# Patient Record
Sex: Female | Born: 1935 | Race: Black or African American | Hispanic: No | Smoking: Current every day smoker
Health system: Southern US, Community
[De-identification: ages and names within clinical notes are randomized; demographics above are authoritative.]

## PROBLEM LIST (undated history)

## (undated) DIAGNOSIS — F209 Schizophrenia, unspecified: Secondary | ICD-10-CM

## (undated) DIAGNOSIS — F22 Delusional disorders: Secondary | ICD-10-CM

## (undated) DIAGNOSIS — F419 Anxiety disorder, unspecified: Secondary | ICD-10-CM

## (undated) DIAGNOSIS — I10 Essential (primary) hypertension: Secondary | ICD-10-CM

## (undated) DIAGNOSIS — J449 Chronic obstructive pulmonary disease, unspecified: Secondary | ICD-10-CM

---

## 2000-08-21 ENCOUNTER — Other Ambulatory Visit: Admission: RE | Admit: 2000-08-21 | Discharge: 2000-08-21 | Payer: Self-pay | Admitting: Family Medicine

## 2000-11-07 ENCOUNTER — Emergency Department (HOSPITAL_COMMUNITY): Admission: EM | Admit: 2000-11-07 | Discharge: 2000-11-07 | Payer: Self-pay | Admitting: *Deleted

## 2001-05-05 ENCOUNTER — Ambulatory Visit (HOSPITAL_COMMUNITY): Admission: RE | Admit: 2001-05-05 | Discharge: 2001-05-05 | Payer: Self-pay | Admitting: Family Medicine

## 2001-05-05 ENCOUNTER — Encounter: Payer: Self-pay | Admitting: Family Medicine

## 2001-06-12 ENCOUNTER — Other Ambulatory Visit: Admission: RE | Admit: 2001-06-12 | Discharge: 2001-06-12 | Payer: Self-pay | Admitting: Family Medicine

## 2002-09-25 ENCOUNTER — Ambulatory Visit (HOSPITAL_COMMUNITY): Admission: RE | Admit: 2002-09-25 | Discharge: 2002-09-25 | Payer: Self-pay | Admitting: Specialist

## 2002-09-25 ENCOUNTER — Encounter: Payer: Self-pay | Admitting: Family Medicine

## 2002-12-30 ENCOUNTER — Encounter: Payer: Self-pay | Admitting: Family Medicine

## 2002-12-30 ENCOUNTER — Ambulatory Visit (HOSPITAL_COMMUNITY): Admission: RE | Admit: 2002-12-30 | Discharge: 2002-12-30 | Payer: Self-pay | Admitting: Family Medicine

## 2003-01-05 ENCOUNTER — Ambulatory Visit (HOSPITAL_COMMUNITY): Admission: RE | Admit: 2003-01-05 | Discharge: 2003-01-05 | Payer: Self-pay | Admitting: Family Medicine

## 2003-01-05 ENCOUNTER — Encounter: Payer: Self-pay | Admitting: Family Medicine

## 2003-11-02 ENCOUNTER — Ambulatory Visit (HOSPITAL_COMMUNITY): Admission: RE | Admit: 2003-11-02 | Discharge: 2003-11-02 | Payer: Self-pay | Admitting: Family Medicine

## 2005-01-15 ENCOUNTER — Ambulatory Visit (HOSPITAL_COMMUNITY): Admission: RE | Admit: 2005-01-15 | Discharge: 2005-01-15 | Payer: Self-pay | Admitting: Family Medicine

## 2006-09-11 ENCOUNTER — Emergency Department (HOSPITAL_COMMUNITY): Admission: EM | Admit: 2006-09-11 | Discharge: 2006-09-11 | Payer: Self-pay | Admitting: *Deleted

## 2007-10-16 ENCOUNTER — Ambulatory Visit (HOSPITAL_COMMUNITY): Admission: RE | Admit: 2007-10-16 | Discharge: 2007-10-16 | Payer: Self-pay | Admitting: Ophthalmology

## 2007-11-13 ENCOUNTER — Ambulatory Visit (HOSPITAL_COMMUNITY): Admission: RE | Admit: 2007-11-13 | Discharge: 2007-11-13 | Payer: Self-pay | Admitting: Ophthalmology

## 2008-04-30 HISTORY — PX: TOTAL HIP ARTHROPLASTY: SHX124

## 2008-07-05 ENCOUNTER — Ambulatory Visit (HOSPITAL_COMMUNITY): Admission: RE | Admit: 2008-07-05 | Discharge: 2008-07-05 | Payer: Self-pay | Admitting: Family Medicine

## 2008-07-07 ENCOUNTER — Ambulatory Visit: Payer: Self-pay | Admitting: Orthopedic Surgery

## 2008-07-07 ENCOUNTER — Inpatient Hospital Stay (HOSPITAL_COMMUNITY): Admission: AD | Admit: 2008-07-07 | Discharge: 2008-07-14 | Payer: Self-pay | Admitting: Family Medicine

## 2008-07-08 ENCOUNTER — Encounter: Payer: Self-pay | Admitting: Orthopedic Surgery

## 2008-07-09 ENCOUNTER — Encounter: Payer: Self-pay | Admitting: Orthopedic Surgery

## 2008-08-09 ENCOUNTER — Ambulatory Visit: Payer: Self-pay | Admitting: Orthopedic Surgery

## 2008-08-09 DIAGNOSIS — S72033A Displaced midcervical fracture of unspecified femur, initial encounter for closed fracture: Secondary | ICD-10-CM

## 2008-08-10 ENCOUNTER — Telehealth: Payer: Self-pay | Admitting: Orthopedic Surgery

## 2008-08-17 ENCOUNTER — Encounter: Payer: Self-pay | Admitting: Orthopedic Surgery

## 2008-08-17 ENCOUNTER — Telehealth: Payer: Self-pay | Admitting: Orthopedic Surgery

## 2008-08-24 ENCOUNTER — Encounter: Payer: Self-pay | Admitting: Orthopedic Surgery

## 2008-09-09 ENCOUNTER — Telehealth: Payer: Self-pay | Admitting: Orthopedic Surgery

## 2008-10-11 ENCOUNTER — Ambulatory Visit: Payer: Self-pay | Admitting: Orthopedic Surgery

## 2010-06-02 IMAGING — CR DG FEMUR 2V*L*
2 series · 2 of 2 positions shown · non-contrast
Comparison: No priors

CLINICAL DATA: Cough and left hip pain

LEFT FEMUR - 2 VIEW

[view not recorded (1 of 2)]
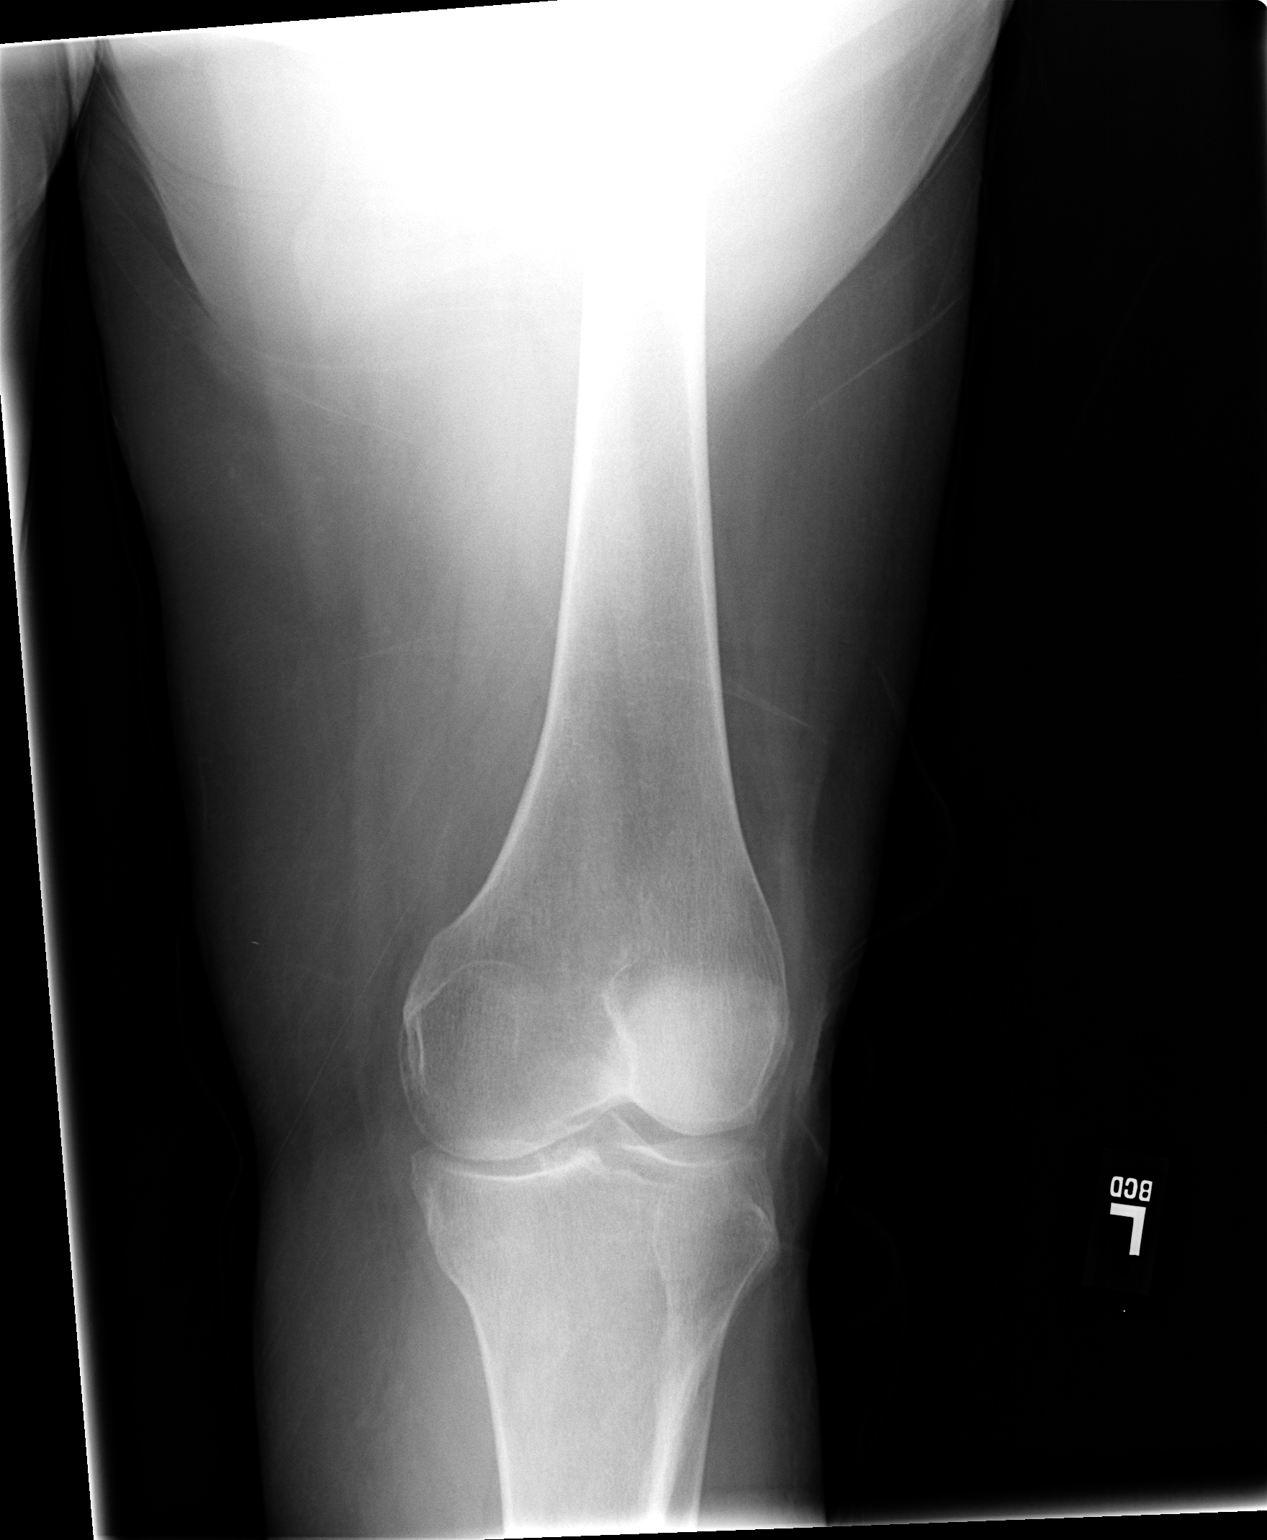

[view not recorded (2 of 2)]
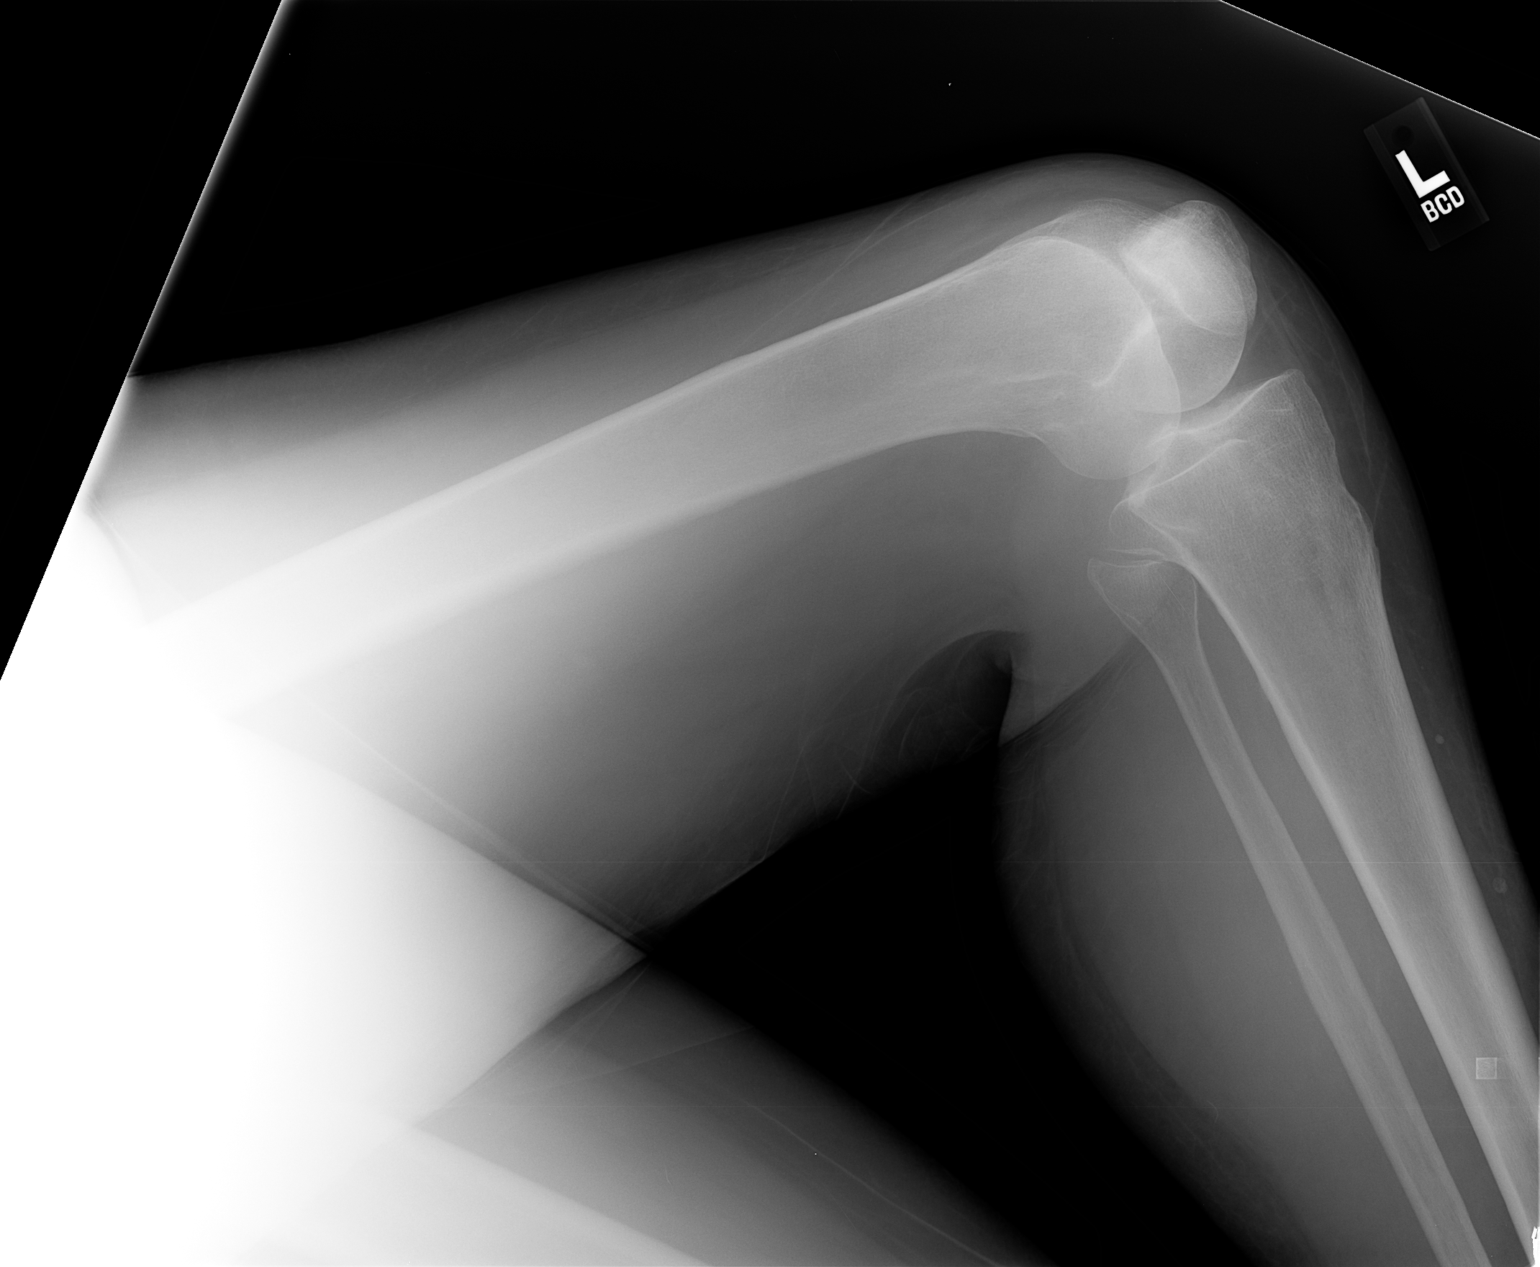

[2 of 2 positions shown; findings below may reference images not displayed]

FINDINGS: The femur is intact.  There is a left subcapital femoral
neck fracture that was discussed in earlier report.
IMPRESSION: No acute changes of the femur distal to the left femoral neck, with
a fracture.

## 2010-08-10 LAB — BASIC METABOLIC PANEL
BUN: 9 mg/dL (ref 6–23)
BUN: 11 mg/dL (ref 6–23)
BUN: 14 mg/dL (ref 6–23)
CO2: 30 mEq/L (ref 19–32)
CO2: 31 mEq/L (ref 19–32)
Calcium: 8.4 mg/dL (ref 8.4–10.5)
Calcium: 8 mg/dL — ABNORMAL LOW (ref 8.4–10.5)
Calcium: 8.3 mg/dL — ABNORMAL LOW (ref 8.4–10.5)
Chloride: 101 mEq/L (ref 96–112)
Chloride: 103 mEq/L (ref 96–112)
Creatinine, Ser: 0.57 mg/dL (ref 0.4–1.2)
Creatinine, Ser: 0.66 mg/dL (ref 0.4–1.2)
GFR calc Af Amer: >60 mL/min (ref >60)
GFR calc Af Amer: >60 mL/min (ref >60)
GFR calc Af Amer: >60 mL/min (ref >60)
GFR calc non Af Amer: >60 mL/min (ref >60)
Glucose, Bld: 97 mg/dL (ref 70–99)
Potassium: 4.1 mEq/L (ref 3.5–5.1)
Potassium: 4.6 mEq/L (ref 3.5–5.1)
Sodium: 136 mEq/L (ref 135–145)

## 2010-08-10 LAB — CBC
HCT: 28.9 % — ABNORMAL LOW (ref 36.0–46.0)
HCT: 25 % — ABNORMAL LOW (ref 36.0–46.0)
HCT: 28.6 % — ABNORMAL LOW (ref 36.0–46.0)
Hemoglobin: 10.8 g/dL — ABNORMAL LOW (ref 12.0–15.0)
Hemoglobin: 8.3 g/dL — ABNORMAL LOW (ref 12.0–15.0)
MCHC: 34.1 g/dL (ref 30.0–36.0)
MCHC: 34.3 g/dL (ref 30.0–36.0)
MCHC: 33.4 g/dL (ref 30.0–36.0)
MCHC: 33.7 g/dL (ref 30.0–36.0)
MCHC: 33.8 g/dL (ref 30.0–36.0)
MCV: 88.8 fL (ref 78.0–100.0)
MCV: 87.5 fL (ref 78.0–100.0)
MCV: 88.3 fL (ref 78.0–100.0)
Platelets: 235 10*3/uL (ref 150–400)
Platelets: 249 10*3/uL (ref 150–400)
Platelets: 191 10*3/uL (ref 150–400)
Platelets: 194 10*3/uL (ref 150–400)
RBC: 3.3 MIL/uL — ABNORMAL LOW (ref 3.87–5.11)
RBC: 2.84 MIL/uL — ABNORMAL LOW (ref 3.87–5.11)
RBC: 3.06 MIL/uL — ABNORMAL LOW (ref 3.87–5.11)
RBC: 3.66 MIL/uL — ABNORMAL LOW (ref 3.87–5.11)
RDW: 14.3 % (ref 11.5–15.5)
WBC: 7.3 10*3/uL (ref 4.0–10.5)
WBC: 10.4 10*3/uL (ref 4.0–10.5)
WBC: 7.5 10*3/uL (ref 4.0–10.5)
WBC: 7.7 10*3/uL (ref 4.0–10.5)
WBC: 9.8 10*3/uL (ref 4.0–10.5)

## 2010-08-10 LAB — DIFFERENTIAL
Basophils Absolute: 0 10*3/uL (ref 0.0–0.1)
Basophils Absolute: 0 10*3/uL (ref 0.0–0.1)
Basophils Absolute: 0 10*3/uL (ref 0.0–0.1)
Basophils Relative: 0 % (ref 0–1)
Basophils Relative: 0 % (ref 0–1)
Basophils Relative: 0 % (ref 0–1)
Basophils Relative: 0 % (ref 0–1)
Eosinophils Absolute: 0.1 10*3/uL (ref 0.0–0.7)
Eosinophils Absolute: 0 10*3/uL (ref 0.0–0.7)
Eosinophils Absolute: 0.1 10*3/uL (ref 0.0–0.7)
Eosinophils Relative: 1 % (ref 0–5)
Eosinophils Relative: 1 % (ref 0–5)
Eosinophils Relative: 1 % (ref 0–5)
Lymphocytes Relative: 7 % — ABNORMAL LOW (ref 12–46)
Lymphocytes Relative: 9 % — ABNORMAL LOW (ref 12–46)
Lymphs Abs: 0.9 10*3/uL (ref 0.7–4.0)
Lymphs Abs: 0.7 10*3/uL (ref 0.7–4.0)
Lymphs Abs: 1 10*3/uL (ref 0.7–4.0)
Monocytes Absolute: 0.9 10*3/uL (ref 0.1–1.0)
Monocytes Absolute: 1.1 10*3/uL — ABNORMAL HIGH (ref 0.1–1.0)
Monocytes Relative: 9 % (ref 3–12)
Monocytes Relative: 10 % (ref 3–12)
Monocytes Relative: 11 % (ref 3–12)
Neutro Abs: 5.7 10*3/uL (ref 1.7–7.7)
Neutro Abs: 6 10*3/uL (ref 1.7–7.7)
Neutro Abs: 7.8 10*3/uL — ABNORMAL HIGH (ref 1.7–7.7)
Neutro Abs: 8.4 10*3/uL — ABNORMAL HIGH (ref 1.7–7.7)
Neutrophils Relative %: 78 % — ABNORMAL HIGH (ref 43–77)
Neutrophils Relative %: 78 % — ABNORMAL HIGH (ref 43–77)
Neutrophils Relative %: 79 % — ABNORMAL HIGH (ref 43–77)

## 2010-08-10 LAB — CROSSMATCH

## 2010-08-10 LAB — COMPREHENSIVE METABOLIC PANEL
ALT: 39 U/L — ABNORMAL HIGH (ref 0–35)
AST: 39 U/L — ABNORMAL HIGH (ref 0–37)
Alkaline Phosphatase: 52 U/L (ref 39–117)
CO2: 30 mEq/L (ref 19–32)
Calcium: 8.8 mg/dL (ref 8.4–10.5)
Chloride: 99 mEq/L (ref 96–112)
GFR calc Af Amer: >60 mL/min (ref >60)
GFR calc non Af Amer: >60 mL/min (ref >60)
Glucose, Bld: 104 mg/dL — ABNORMAL HIGH (ref 70–99)
Sodium: 137 mEq/L (ref 135–145)
Total Bilirubin: 0.6 mg/dL (ref 0.3–1.2)

## 2010-08-10 LAB — PROTIME-INR: INR: 1 (ref 0.00–1.49)

## 2010-08-10 LAB — ABO/RH: ABO/RH(D): O POS

## 2010-08-10 LAB — T4, FREE: Free T4: 1.26 ng/dL (ref 0.89–1.80)

## 2010-08-10 LAB — TSH: TSH: 1.22 u[IU]/mL (ref 0.350–4.500)

## 2010-09-12 NOTE — Group Therapy Note (Signed)
[name], [name] NO.:  [id number]   MEDICAL RECORD NO.:  [id number]          PATIENT TYPE:  INP   LOCATION:  A309                          FACILITY:  [facility]   PHYSICIAN:  Vickki Hearing, M.D.DATE OF BIRTH:  [birth year]   DATE OF PROCEDURE:  DATE OF DISCHARGE:                                 PROGRESS NOTE   This is a second postop day status post bipolar hip replacement on the  left hip.  Her vital signs are 98.4 temperature, pulse 97, respiratory  rate 22, blood pressure 135/65, O2 sat 97 on 2 liters nasal cannula.  Lab results today show basic metabolic panel is normal.  CBC shows a  hemoglobin of 8.3.  We will transfuse her 2 units of blood.   She can progress with physical therapy as tolerated, seems to be  tolerating a low hemoglobin well.  Continue with postop protocol.      Vickki Hearing, M.D.  Electronically Signed     SEH/MEDQ  D:  07/11/2008  T:  07/12/2008  Job:  [id number]
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
## 2010-09-12 NOTE — Op Note (Signed)
[name], [name]             ACCOUNT NO.:  [id number]   MEDICAL RECORD NO.:  [id number]         PATIENT TYPE:  PINP   LOCATION:  IC09                          FACILITY:  [facility]   PHYSICIAN:  Vickki Hearing, M.D.DATE OF BIRTH:  March 19, 1932   DATE OF PROCEDURE:  07/09/2008  DATE OF DISCHARGE:                               OPERATIVE REPORT   Date of injury unknown.   HISTORY:  A 77 year old female presented to medical doctors office with  pain in her left hip and a limp.  The patient had been limping for  approximately 2 weeks.  She fell second time in her bathroom and was  unable to walk.  She was admitted to the hospital.  She had a admission  and clearance for surgery.   PREOPERATIVE DIAGNOSIS:  Left femoral neck fracture.   POSTOPERATIVE DIAGNOSIS:  Left femoral neck fracture.   PROCEDURE:  Bipolar partial hip replacement.   SURGEON:  Vickki Hearing, MD.   ANESTHETIC:  Spinal.   OPERATIVE FINDINGS:  There was a fracture of the femoral neck with no  evidence of hematoma indicating that this fracture was definitely old,  acetabulum was normal.   ASSISTANT:  Chief Financial Officer.   BLOOD LOSS:  250 mL, estimated.   INDICATIONS FOR PROCEDURE:  Hip fracture and the patient could not walk.   The patient was identified preoperatively and site marking was  performed.  The patient was taken to Surgery for spinal anesthetic and a  gram of Ancef was given.   The patient was then placed in lateral decubitus position with an  axillary roll and padding of the well leg and the left side was placed  in the upright position.   A Stulberg hip positioner was used to keep the patient in lateral  decubitus position.   After DuraPrep and sterile draping, time-out procedure was initiated and  completed.   A slight curvilinear incision was made over the greater trochanter  extended proximally and distally down to the fascial layer.   The fascial layer was split in line with the  skin incision and  retracted.  The anterior half of the gluteus medius and the entire  gluteus minimus were reflected from the greater trochanter and retracted  proximally and held in place with Steinmann pins.  A capsulotomy was  performed and the hip was dislocated anteriorly.  The femoral head was  removed and the acetabulum was inspected after thorough irrigation.   The head was measured and measured to size 49.   We then placed a starter reamer, box cutter, trochanteric lateralizer,  and then serial broaches up to a size 13, we trialed with a 04 and +8  neck length with a 49-hip ball and 49-hip ball with +8 neck gave the  best stability in flexion, internal rotation, and then extension,  external rotation, and we were able to maintain 90 degrees of knee  flexion.   The trial components were then removed, two #5 Tycron sutures were  passed through drill holes in the lateral femur and greater trochanter  and then the actual stem was implanted.  The head and neck were then  placed and range of motion tests were repeated and matched the trial  components.   We then repaired the abductors anatomically, repaired the fascial layer  with interrupted #1 Reglan sutures followed by 0 Monocryl and then  staples.  We did inject 30 mL Marcaine with epinephrine beneath the  fascial layer prior to closure.   The patient was placed in abduction pillow.  Leg lengths were checked  and there were found to be nearly anatomic and equal.   Postop plan, the patient will be full weightbearing, we will use foot  pumps, and Lovenox for DVT prevention, and we will arrange placement in  3 days.      Vickki Hearing, M.D.  Electronically Signed     SEH/MEDQ  D:  07/09/2008  T:  07/10/2008  Job:  [id number]

## 2010-09-12 NOTE — Consult Note (Signed)
[name], FARVE NO.:  [id number]   MEDICAL RECORD NO.:  [id number]          PATIENT TYPE:  INP   LOCATION:  A309                          FACILITY:  [facility]   PHYSICIAN:  Vickki Hearing, M.D.DATE OF BIRTH:  30-Jul-1931   DATE OF CONSULTATION:  DATE OF DISCHARGE:                                 CONSULTATION   She is admitted with a diagnosis of left femoral neck fracture.   She has a chief complaint of inability to walk with a limp, but only  mild left hip pain.   This is a 77 year old female with previous history of hypertension and  mental disease who is in her normal state of health at a local family  care home and was noted to be limping about a week or so ago and then  fell again in the bathroom and could not walk.  She was admitted to the  hospital and consult was requested for orthopedic evaluation.  X-rays  have already been done and showed a left femoral neck fracture.  It  appears to be more than several days older.  It is completely displaced  femoral neck fracture and will require partial hip replacement.  As far  as we can tell from the patient and her daughter there are no other  medical diseases.  The patient was in her normal state of health.  She  does take an inhaler, Haldol, antianxiety medicine and verapamil.  She  has no known drug allergies.  There is some history of paranoid  schizophrenia.   Examination shows blood pressure is 128/65, room air sat 98%, pulse 95,  respiratory rate 20, temperature 98.5.  Her overall appearance was  normal.  Her cardiovascular exam showed good distal pulses.  Her lymph  node exam.  Her abdominal exam showed protuberant abdomen, but it was  soft, nondistended, and nontender.  Her skin was dry, but intact with no  rashes or ulcers.  She was awake and alert.  She answered to questions  appropriately.  She was in pleasant mood.   Her upper extremities were very well aligned without contractures,  subluxation, atrophy, or tremor.  An excellent muscle strength and  muscle tone.   The right lower extremity the same.   Left lower extremity was held in external rotation and shortened with  the knee flexed.  There is pain with any motion of the left lower  extremity.   IMPRESSION:  Left femoral neck fracture, completely displaced.  Plan for  partial hip replacement.   Laboratory studies show T4 level of 1.26 normal.  TSH level of 1.220.  Comprehensive metabolic panel was normal with a glucose of 104.  Liver  function test showed SGOT and SGPT of 39 and 39 which are read as high,  total protein was low at 5.9, albumin was low at 2.7, calcium was 8.8,  white count was 7.5, hemoglobin 10.8, platelet count 192, neutrophils  78%.   There are no other surgical alternatives with this patient if she will  walk again.  She needs to have the hip replacement.  Risks of bleeding,  infection, deep vein thrombosis, pulmonary embolism, leg length  discrepancy are all noted.  Surgery is scheduled for Friday.      Vickki Hearing, M.D.  Electronically Signed     SEH/MEDQ  D:  07/08/2008  T:  07/08/2008  Job:  [id number]

## 2010-09-12 NOTE — H&P (Signed)
[name], [name] NO.:  [id number]   MEDICAL RECORD NO.:  [id number]          PATIENT TYPE:  INP   LOCATION:  A309                          FACILITY:  [facility]   PHYSICIAN:  Melvyn Novas, MDDATE OF BIRTH:  03-26-1932   DATE OF ADMISSION:  07/07/2008  DATE OF DISCHARGE:  LH                              HISTORY & PHYSICAL   The patient is a 77 year old black female, known paranoid schizophrenic  with a history of hypertension and generalized anxiety disorder who  resides in an assisted-living facility.  The patient is seen in my  office as an outpatient.  She has been very well controlled lately.  Apparently, she had an inability to walk for the last 4-5 days.  She was  given a wheelchair and presented to my office due to inability to bear  weight.  X-ray was gotten of her hip, which came back to my office and  revealed fracture of the left femoral neck I believe.  She was  subsequently admitted to the hospital today for addressing the above  situation.  She will be placed on Lovenox and orthopedic consultation  obtained.  She specifically denies any fall or trauma but, however,  according to the assisted living staff, she ceased walking at about 5  days prior to admission.  She is not quite reliable on history due to  the psychiatric abnormality.   PAST MEDICAL HISTORY:  Significant for the aforementioned paranoid  schizophrenia, hypertension, asthmatic bronchitis, delusions, and  generalized anxiety.   PAST SURGICAL HISTORY:  Unremarkable.   CURRENT MEDICATIONS:  1. Haldol 50 mg IM q.2 weeks.  2. Paxil 20 mg p.o. daily.  3. Xanax 0.25 mg p.o. b.i.d. p.r.n.  4. Xopenex inhaler 2 puffs q.i.d. p.r.n.   ALLERGIES:  She has no known allergies.   PHYSICAL EXAMINATION:  VITALS:  Blood pressure 136/73, temperature 97.9,  pulse is 86 and regular, respiratory rate is 20, and O2 sat is 99%.  HEENT:  Eyes, PERRLA.  Extraocular movements intact.  Sclerae  clear.  Conjunctivae pink.  NECK:  No JVD.  No carotid  bruits.  No thyromegaly.  LUNGS:  Clear to A and P.  No rales, wheezes, or rhonchi.  HEART:  Regular rhythm.  No murmurs, gallops, heaves, thrills, or rubs.  ABDOMEN:  Soft and nontender.  Bowel sounds are normoactive.  No  guarding, rebound, masses, or hepatosplenomegaly.  EXTREMITIES:  Trace to 1+ pedal edema.  Left leg shows pain on external  rotation and elevation, mild edema of the left leg all the way up.  Homans sign is negative.  No palpable cord.   IMPRESSION:  1. Fracture of left femoral neck.  2. Paranoid schizophrenia, well controlled.  3. Hypertension, well controlled.  4. Generalized anxiety, well controlled.   PLAN:  To admit and obtain orthopedic consult.  Lovenox 1 mg/kg subcu  q.24 h.  I will make further recommendations as the database expands.      Melvyn Novas, MD  Electronically Signed     RMD/MEDQ  D:  07/07/2008  T:  07/08/2008  Job:  [id number] 

## 2010-09-12 NOTE — Group Therapy Note (Signed)
NAMECHACE, BISCH NO.:  [id number]   MEDICAL RECORD NO.:  [id number]          PATIENT TYPE:  INP   LOCATION:  A309                          FACILITY:  [facility]   PHYSICIAN:  Vickki Hearing, M.D.DATE OF BIRTH:  07/07/1931   DATE OF PROCEDURE:  DATE OF DISCHARGE:                                 PROGRESS NOTE   She is postop day 1 from left bipolar hip replacement.  She is in stable  condition.  She is awake and alert.  She is sitting in a chair.  Her  vital signs are temperature 98.1 with a max of 101.1, pulse 97,  respirations 20, last blood pressure 107/69, and O2 sat 99%.   Laboratory results show basic metabolic panel is normal with a glucose  of 165 and her morning hemoglobin is 8.9.  She has a type and cross  match which expires on the 14th.  Her initial hemoglobin was 10.8, so we  have to transfuse her tomorrow that will be fine.   Otherwise, she is doing very well.      Vickki Hearing, M.D.  Electronically Signed     SEH/MEDQ  D:  07/10/2008  T:  07/11/2008  Job:  [id number]

## 2010-09-12 NOTE — Discharge Summary (Signed)
[name], [name] NO.:  [id number]   MEDICAL RECORD NO.:  [id number]          PATIENT TYPE:  INP   LOCATION:  A309                          FACILITY:  [facility]   PHYSICIAN:  Melvyn Novas, MDDATE OF BIRTH:  Sep 11, 1931   DATE OF ADMISSION:  07/07/2008  DATE OF DISCHARGE:  LH                               DISCHARGE SUMMARY   The patient is a 77 year old black female who is followed by me as an  outpatient.  She had chronic well-controlled paranoid schizophrenia and  generalized anxiety disorder, occasional delusions, hypertension, and  COPD.  She was doing well.  She apparently had inability to walk, she  had a fractured femoral neck which she had replaced with a left hip  arthroplasty by Dr. Romeo Apple.  She did well.  She had some mild postop  anemia.  She was transfused 2 units and she continued to do well.  She  was hemodynamically stable, had no significant postop complications  other than the aforementioned anemia.  She was continued on her current  medicines of Paxil 20 mg per day, Xanax 0.25 mg p.o. b.i.d., Xopenex  inhaler 2 puffs q.i.d.  Her schizophrenia medicines are Haldol 50 mg IM  every 2 weeks; this is an anticoagulative regimen, although I must admit  she is well controlled on this, and I have continued to do as an  outpatient in her assisted living facility that seems to work well.  She  will be transferred to an assisted living or skilled nursing facility  for several weeks.  It might be advantageous to continue the same  regimen in an anticipation she may be well enough to return to her  assisted living facility and continue her same outpatient regimen for  chronic schizophrenia, which is well controlled.      Melvyn Novas, MD  Electronically Signed     RMD/MEDQ  D:  07/13/2008  T:  07/14/2008  Job:  [id number]

## 2011-01-25 LAB — BASIC METABOLIC PANEL
BUN: 14
Chloride: 102
Creatinine, Ser: 0.86
Glucose, Bld: 84
Potassium: 3.4 — ABNORMAL LOW

## 2011-11-29 ENCOUNTER — Other Ambulatory Visit (HOSPITAL_COMMUNITY): Payer: Self-pay | Admitting: Family Medicine

## 2011-11-29 DIAGNOSIS — Z139 Encounter for screening, unspecified: Secondary | ICD-10-CM

## 2011-12-03 ENCOUNTER — Ambulatory Visit (HOSPITAL_COMMUNITY): Payer: Self-pay

## 2011-12-24 ENCOUNTER — Inpatient Hospital Stay (HOSPITAL_COMMUNITY): Admission: RE | Admit: 2011-12-24 | Payer: Self-pay | Source: Ambulatory Visit

## 2012-01-01 ENCOUNTER — Ambulatory Visit (HOSPITAL_COMMUNITY): Payer: Self-pay

## 2012-01-10 ENCOUNTER — Inpatient Hospital Stay (HOSPITAL_COMMUNITY): Admission: RE | Admit: 2012-01-10 | Payer: Self-pay | Source: Ambulatory Visit

## 2012-01-14 ENCOUNTER — Ambulatory Visit (HOSPITAL_COMMUNITY)
Admission: RE | Admit: 2012-01-14 | Discharge: 2012-01-14 | Disposition: A | Discharge status: Home / Self Care | Payer: Medicare Other | Source: Ambulatory Visit | Attending: Family Medicine | Admitting: Family Medicine

## 2012-01-14 DIAGNOSIS — Z1231 Encounter for screening mammogram for malignant neoplasm of breast: Secondary | ICD-10-CM | POA: Insufficient documentation

## 2012-01-14 DIAGNOSIS — Z139 Encounter for screening, unspecified: Secondary | ICD-10-CM

## 2014-09-10 ENCOUNTER — Other Ambulatory Visit (HOSPITAL_COMMUNITY): Payer: Self-pay | Admitting: Family Medicine

## 2014-09-10 ENCOUNTER — Ambulatory Visit (HOSPITAL_COMMUNITY)
Admission: RE | Admit: 2014-09-10 | Discharge: 2014-09-10 | Disposition: A | Discharge status: Home / Self Care | Payer: Medicare Other | Source: Ambulatory Visit | Attending: Family Medicine | Admitting: Family Medicine

## 2014-09-10 DIAGNOSIS — M25552 Pain in left hip: Secondary | ICD-10-CM | POA: Diagnosis present

## 2014-10-07 ENCOUNTER — Ambulatory Visit (INDEPENDENT_AMBULATORY_CARE_PROVIDER_SITE_OTHER): Payer: Medicare Other | Admitting: Orthopedic Surgery

## 2014-10-07 VITALS — BP 159/80 | Ht 62.0 in | Wt 187.0 lb

## 2014-10-07 DIAGNOSIS — R29898 Other symptoms and signs involving the musculoskeletal system: Secondary | ICD-10-CM

## 2014-10-07 DIAGNOSIS — Z9660 Presence of unspecified orthopedic joint implant: Secondary | ICD-10-CM

## 2014-10-07 DIAGNOSIS — Z96649 Presence of unspecified artificial hip joint: Secondary | ICD-10-CM

## 2014-10-07 NOTE — Patient Instructions (Signed)
Xray at hospital, will call with results

## 2014-10-07 NOTE — Progress Notes (Signed)
Patient ID: [name], female   DOB: 07-14-1931, 81 y.o.   MRN: [id number] New patient last seen in 2010 Chief Complaint  Patient presents with  . Leg Problem    Leg weakness Referred by Dr. Janna Arch.     [name] is a 81 y.o. female.   HPI 81 year old female had a left femoral neck fracture treated with a press-fit bipolar stem comes in complaining that she has weakness in her left leg and can't put pressure on it like she used to. She is at a care home and the caregiver says that she is limping worse than she did after her hip replacement and a he's noticed that she is having more trouble walking  She specifically denies pain in her left leg or thigh and denies numbness or tinging in her left leg just says it's week  She is a poor historian  She reports wheezing and anxiety as review of systems. No medical history as given no surgical history medication list medical allergies are noted on her history and the caregiver can give me much information family history was given  She does smoke doesn't drink   Review of Systems See hpi  No past medical history on file.  No past surgical history on file.  No family history on file.  Social History History  Substance Use Topics  . Smoking status: Not on file  . Smokeless tobacco: Not on file  . Alcohol Use: Not on file    No Known Allergies  No current outpatient prescriptions on file.   No current facility-administered medications for this visit.       Physical Exam Blood pressure 159/80, height 5\' 2"  (1.575 m), weight 187 lb (84.823 kg). Physical Exam The patient is well developed well nourished and well groomed. Orientation to person place and time is normal  Mood is pleasant. Ambulatory status she is a household ambulator with a rolling walker. Her motor exam shows normal hip flexion strength normal extension power normal dorsiflexion plantar flexion power in both legs bilaterally no instability in  the hip joint skin is intact neurovascular exam normal  Data Reviewed X-ray was done at the hospital it was read as a loose prosthesis but there is no loosening noted in the prosthesis or the x-ray. There has been some settling which is normal for this implant which was press-fit. I did look at the previous films. But disagree that there is any loosening of the stem.    Assessment Do not take loosening of the stem and I detect no weakness in her legs. I would like to get a lumbar spine film despite the fact that she had no lumbar spine tenderness. I would like to check this to make sure she doesn't have a L1 or 2 root lesion that would be pressing on her nerve to cause her weakness in the left leg Plan L-spine films call caregiver with results and make treatment decision after that

## 2014-10-12 ENCOUNTER — Other Ambulatory Visit: Payer: Self-pay | Admitting: Orthopedic Surgery

## 2014-10-12 ENCOUNTER — Ambulatory Visit (HOSPITAL_COMMUNITY)
Admission: RE | Admit: 2014-10-12 | Discharge: 2014-10-12 | Disposition: A | Discharge status: Home / Self Care | Payer: Medicare Other | Source: Ambulatory Visit | Attending: Orthopedic Surgery | Admitting: Orthopedic Surgery

## 2014-10-12 DIAGNOSIS — R29898 Other symptoms and signs involving the musculoskeletal system: Secondary | ICD-10-CM

## 2014-11-09 ENCOUNTER — Inpatient Hospital Stay (HOSPITAL_COMMUNITY): Payer: Medicare Other

## 2014-11-09 ENCOUNTER — Other Ambulatory Visit: Payer: Self-pay

## 2014-11-09 ENCOUNTER — Emergency Department (HOSPITAL_COMMUNITY): Payer: Medicare Other

## 2014-11-09 ENCOUNTER — Other Ambulatory Visit (HOSPITAL_COMMUNITY): Payer: Self-pay | Admitting: Family Medicine

## 2014-11-09 ENCOUNTER — Inpatient Hospital Stay (HOSPITAL_COMMUNITY)
Admission: EM | Admit: 2014-11-09 | Discharge: 2014-11-29 | DRG: 296 | Disposition: E | Payer: Medicare Other | Attending: Internal Medicine | Admitting: Internal Medicine

## 2014-11-09 ENCOUNTER — Ambulatory Visit (HOSPITAL_COMMUNITY)
Admission: RE | Admit: 2014-11-09 | Discharge: 2014-11-09 | Disposition: A | Discharge status: Home / Self Care | Payer: Medicare Other | Source: Ambulatory Visit | Attending: Family Medicine | Admitting: Family Medicine

## 2014-11-09 ENCOUNTER — Encounter (HOSPITAL_COMMUNITY): Payer: Self-pay | Admitting: Emergency Medicine

## 2014-11-09 DIAGNOSIS — R0902 Hypoxemia: Secondary | ICD-10-CM

## 2014-11-09 DIAGNOSIS — F17210 Nicotine dependence, cigarettes, uncomplicated: Secondary | ICD-10-CM | POA: Diagnosis present

## 2014-11-09 DIAGNOSIS — Z791 Long term (current) use of non-steroidal anti-inflammatories (NSAID): Secondary | ICD-10-CM

## 2014-11-09 DIAGNOSIS — E162 Hypoglycemia, unspecified: Secondary | ICD-10-CM | POA: Diagnosis not present

## 2014-11-09 DIAGNOSIS — R0602 Shortness of breath: Secondary | ICD-10-CM | POA: Insufficient documentation

## 2014-11-09 DIAGNOSIS — Z96642 Presence of left artificial hip joint: Secondary | ICD-10-CM | POA: Diagnosis present

## 2014-11-09 DIAGNOSIS — Z6837 Body mass index (BMI) 37.0-37.9, adult: Secondary | ICD-10-CM | POA: Diagnosis not present

## 2014-11-09 DIAGNOSIS — J988 Other specified respiratory disorders: Secondary | ICD-10-CM | POA: Diagnosis not present

## 2014-11-09 DIAGNOSIS — R400 Somnolence: Secondary | ICD-10-CM | POA: Diagnosis not present

## 2014-11-09 DIAGNOSIS — D638 Anemia in other chronic diseases classified elsewhere: Secondary | ICD-10-CM | POA: Diagnosis present

## 2014-11-09 DIAGNOSIS — J918 Pleural effusion in other conditions classified elsewhere: Secondary | ICD-10-CM | POA: Insufficient documentation

## 2014-11-09 DIAGNOSIS — J9690 Respiratory failure, unspecified, unspecified whether with hypoxia or hypercapnia: Secondary | ICD-10-CM | POA: Insufficient documentation

## 2014-11-09 DIAGNOSIS — Z9911 Dependence on respirator [ventilator] status: Secondary | ICD-10-CM | POA: Diagnosis not present

## 2014-11-09 DIAGNOSIS — E663 Overweight: Secondary | ICD-10-CM | POA: Diagnosis present

## 2014-11-09 DIAGNOSIS — J9602 Acute respiratory failure with hypercapnia: Secondary | ICD-10-CM | POA: Diagnosis present

## 2014-11-09 DIAGNOSIS — Z66 Do not resuscitate: Secondary | ICD-10-CM | POA: Diagnosis not present

## 2014-11-09 DIAGNOSIS — N179 Acute kidney failure, unspecified: Secondary | ICD-10-CM

## 2014-11-09 DIAGNOSIS — R4020 Unspecified coma: Secondary | ICD-10-CM | POA: Diagnosis present

## 2014-11-09 DIAGNOSIS — E875 Hyperkalemia: Secondary | ICD-10-CM | POA: Diagnosis not present

## 2014-11-09 DIAGNOSIS — I509 Heart failure, unspecified: Secondary | ICD-10-CM | POA: Insufficient documentation

## 2014-11-09 DIAGNOSIS — I469 Cardiac arrest, cause unspecified: Principal | ICD-10-CM | POA: Diagnosis present

## 2014-11-09 DIAGNOSIS — G931 Anoxic brain damage, not elsewhere classified: Secondary | ICD-10-CM | POA: Diagnosis present

## 2014-11-09 DIAGNOSIS — R569 Unspecified convulsions: Secondary | ICD-10-CM | POA: Diagnosis not present

## 2014-11-09 DIAGNOSIS — J13 Pneumonia due to Streptococcus pneumoniae: Secondary | ICD-10-CM | POA: Diagnosis present

## 2014-11-09 DIAGNOSIS — J449 Chronic obstructive pulmonary disease, unspecified: Secondary | ICD-10-CM | POA: Diagnosis present

## 2014-11-09 DIAGNOSIS — Z515 Encounter for palliative care: Secondary | ICD-10-CM

## 2014-11-09 DIAGNOSIS — Z79899 Other long term (current) drug therapy: Secondary | ICD-10-CM | POA: Diagnosis not present

## 2014-11-09 DIAGNOSIS — N170 Acute kidney failure with tubular necrosis: Secondary | ICD-10-CM | POA: Diagnosis present

## 2014-11-09 DIAGNOSIS — E872 Acidosis, unspecified: Secondary | ICD-10-CM

## 2014-11-09 DIAGNOSIS — F419 Anxiety disorder, unspecified: Secondary | ICD-10-CM | POA: Diagnosis present

## 2014-11-09 DIAGNOSIS — F209 Schizophrenia, unspecified: Secondary | ICD-10-CM | POA: Diagnosis present

## 2014-11-09 DIAGNOSIS — R4182 Altered mental status, unspecified: Secondary | ICD-10-CM | POA: Insufficient documentation

## 2014-11-09 DIAGNOSIS — R40243 Glasgow coma scale score 3-8: Secondary | ICD-10-CM | POA: Diagnosis not present

## 2014-11-09 DIAGNOSIS — J9601 Acute respiratory failure with hypoxia: Secondary | ICD-10-CM | POA: Diagnosis present

## 2014-11-09 DIAGNOSIS — G253 Myoclonus: Secondary | ICD-10-CM | POA: Diagnosis present

## 2014-11-09 DIAGNOSIS — I10 Essential (primary) hypertension: Secondary | ICD-10-CM | POA: Diagnosis present

## 2014-11-09 DIAGNOSIS — Z87891 Personal history of nicotine dependence: Secondary | ICD-10-CM

## 2014-11-09 HISTORY — DX: Delusional disorders: F22

## 2014-11-09 HISTORY — DX: Chronic obstructive pulmonary disease, unspecified: J44.9

## 2014-11-09 HISTORY — DX: Schizophrenia, unspecified: F20.9

## 2014-11-09 HISTORY — DX: Anxiety disorder, unspecified: F41.9

## 2014-11-09 HISTORY — DX: Essential (primary) hypertension: I10

## 2014-11-09 LAB — I-STAT CHEM 8, ED
BUN: 43 mg/dL — ABNORMAL HIGH (ref 6–20)
CHLORIDE: 105 mmol/L (ref 101–111)
Calcium, Ion: 1.09 mmol/L — ABNORMAL LOW (ref 1.13–1.30)
Creatinine, Ser: 2.1 mg/dL — ABNORMAL HIGH (ref 0.44–1.00)
Glucose, Bld: 182 mg/dL — ABNORMAL HIGH (ref 65–99)
HCT: 37 % (ref 36.0–46.0)
HEMOGLOBIN: 12.6 g/dL (ref 12.0–15.0)
POTASSIUM: 4.7 mmol/L (ref 3.5–5.1)
SODIUM: 146 mmol/L — AB (ref 135–145)
TCO2: 26 mmol/L (ref 0–100)

## 2014-11-09 LAB — APTT: APTT: 23 s — AB (ref 24–37)

## 2014-11-09 LAB — CBC WITH DIFFERENTIAL/PLATELET
BASOS ABS: 0 10*3/uL (ref 0.0–0.1)
Basophils Absolute: 0 10*3/uL (ref 0.0–0.1)
Basophils Relative: 0 % (ref 0–1)
Basophils Relative: 0 % (ref 0–1)
EOS ABS: 0 10*3/uL (ref 0.0–0.7)
EOS ABS: 0 10*3/uL (ref 0.0–0.7)
Eosinophils Relative: 0 % (ref 0–5)
Eosinophils Relative: 0 % (ref 0–5)
HCT: 36.2 % (ref 36.0–46.0)
HEMATOCRIT: 36.2 % (ref 36.0–46.0)
HEMOGLOBIN: 10.8 g/dL — AB (ref 12.0–15.0)
HEMOGLOBIN: 11.2 g/dL — AB (ref 12.0–15.0)
LYMPHS ABS: 2.2 10*3/uL (ref 0.7–4.0)
Lymphocytes Relative: 24 % (ref 12–46)
Lymphocytes Relative: 4 % — ABNORMAL LOW (ref 12–46)
Lymphs Abs: 0.6 10*3/uL — ABNORMAL LOW (ref 0.7–4.0)
MCH: 29 pg (ref 26.0–34.0)
MCH: 29.2 pg (ref 26.0–34.0)
MCHC: 29.8 g/dL — ABNORMAL LOW (ref 30.0–36.0)
MCHC: 30.9 g/dL (ref 30.0–36.0)
MCV: 97.3 fL (ref 78.0–100.0)
MCV: 94.3 fL (ref 78.0–100.0)
MONO ABS: 0.7 10*3/uL (ref 0.1–1.0)
MONOS PCT: 10 % (ref 3–12)
Monocytes Absolute: 1.3 10*3/uL — ABNORMAL HIGH (ref 0.1–1.0)
Monocytes Relative: 8 % (ref 3–12)
NEUTROS ABS: 10.8 10*3/uL — AB (ref 1.7–7.7)
Neutro Abs: 6.1 10*3/uL (ref 1.7–7.7)
Neutrophils Relative %: 68 % (ref 43–77)
Neutrophils Relative %: 86 % — ABNORMAL HIGH (ref 43–77)
PLATELETS: 186 10*3/uL (ref 150–400)
Platelets: 179 10*3/uL (ref 150–400)
RBC: 3.72 MIL/uL — AB (ref 3.87–5.11)
RBC: 3.84 MIL/uL — AB (ref 3.87–5.11)
RDW: 17.8 % — AB (ref 11.5–15.5)
RDW: 17.6 % — ABNORMAL HIGH (ref 11.5–15.5)
WBC: 9 10*3/uL (ref 4.0–10.5)
WBC: 12.6 10*3/uL — AB (ref 4.0–10.5)

## 2014-11-09 LAB — BLOOD GAS, ARTERIAL
ACID-BASE DEFICIT: 2.3 mmol/L — AB (ref 0.0–2.0)
Acid-Base Excess: 2.9 mmol/L — ABNORMAL HIGH (ref 0.0–2.0)
Bicarbonate: 24.9 mEq/L — ABNORMAL HIGH (ref 20.0–24.0)
Bicarbonate: 28.6 mEq/L — ABNORMAL HIGH (ref 20.0–24.0)
DRAWN BY: 234301
Drawn by: 398981
FIO2: 100 %
FIO2: 0.8 %
LHR: 15 {breaths}/min
LHR: 12 {breaths}/min
MECHVT: 500 mL
MECHVT: 500 mL
O2 SAT: 98.9 %
O2 SAT: 98.4 %
PEEP: 5 cmH2O
PEEP: 5 cmH2O
Patient temperature: 37
Patient temperature: 98.6
TCO2: 24.1 mmol/L (ref 0–100)
TCO2: 30.3 mmol/L (ref 0–100)
pCO2 arterial: 67.7 mmHg (ref 35.0–45.0)
pCO2 arterial: 58.2 mmHg (ref 35.0–45.0)
pH, Arterial: 7.192 — CL (ref 7.350–7.450)
pH, Arterial: 7.312 — ABNORMAL LOW (ref 7.350–7.450)
pO2, Arterial: 224 mmHg — ABNORMAL HIGH (ref 80.0–100.0)
pO2, Arterial: 119 mmHg — ABNORMAL HIGH (ref 80.0–100.0)

## 2014-11-09 LAB — COMPREHENSIVE METABOLIC PANEL
ALBUMIN: 3.6 g/dL (ref 3.5–5.0)
ALK PHOS: 100 U/L (ref 38–126)
ALT: 119 U/L — AB (ref 14–54)
ANION GAP: 14 (ref 5–15)
AST: 119 U/L — ABNORMAL HIGH (ref 15–41)
BUN: 45 mg/dL — ABNORMAL HIGH (ref 6–20)
CALCIUM: 8.1 mg/dL — AB (ref 8.9–10.3)
CO2: 26 mmol/L (ref 22–32)
CREATININE: 2.46 mg/dL — AB (ref 0.44–1.00)
Chloride: 105 mmol/L (ref 101–111)
GFR calc Af Amer: 20 mL/min — ABNORMAL LOW (ref >60)
GFR calc non Af Amer: 17 mL/min — ABNORMAL LOW (ref >60)
Glucose, Bld: 187 mg/dL — ABNORMAL HIGH (ref 65–99)
Potassium: 4.8 mmol/L (ref 3.5–5.1)
SODIUM: 145 mmol/L (ref 135–145)
Total Bilirubin: 0.8 mg/dL (ref 0.3–1.2)
Total Protein: 6.4 g/dL — ABNORMAL LOW (ref 6.5–8.1)

## 2014-11-09 LAB — LIPASE, BLOOD: LIPASE: 12 U/L — AB (ref 22–51)

## 2014-11-09 LAB — TROPONIN I: TROPONIN I: 0.08 ng/mL — AB (ref <0.031)

## 2014-11-09 LAB — BASIC METABOLIC PANEL
Anion gap: 10 (ref 5–15)
Anion gap: 8 (ref 5–15)
BUN: 45 mg/dL — AB (ref 6–20)
BUN: 45 mg/dL — ABNORMAL HIGH (ref 6–20)
CO2: 28 mmol/L (ref 22–32)
CO2: 30 mmol/L (ref 22–32)
Calcium: 8.1 mg/dL — ABNORMAL LOW (ref 8.9–10.3)
Calcium: 8 mg/dL — ABNORMAL LOW (ref 8.9–10.3)
Chloride: 107 mmol/L (ref 101–111)
Chloride: 107 mmol/L (ref 101–111)
Creatinine, Ser: 2.27 mg/dL — ABNORMAL HIGH (ref 0.44–1.00)
Creatinine, Ser: 2.3 mg/dL — ABNORMAL HIGH (ref 0.44–1.00)
GFR calc Af Amer: 22 mL/min — ABNORMAL LOW (ref >60)
GFR calc Af Amer: 22 mL/min — ABNORMAL LOW (ref >60)
GFR calc non Af Amer: 19 mL/min — ABNORMAL LOW (ref >60)
GFR calc non Af Amer: 19 mL/min — ABNORMAL LOW (ref >60)
Glucose, Bld: 138 mg/dL — ABNORMAL HIGH (ref 65–99)
Glucose, Bld: 154 mg/dL — ABNORMAL HIGH (ref 65–99)
POTASSIUM: 5.6 mmol/L — AB (ref 3.5–5.1)
Potassium: 4.7 mmol/L (ref 3.5–5.1)
SODIUM: 145 mmol/L (ref 135–145)
Sodium: 145 mmol/L (ref 135–145)

## 2014-11-09 LAB — AMYLASE: AMYLASE: 50 U/L (ref 28–100)

## 2014-11-09 LAB — MRSA PCR SCREENING: MRSA BY PCR: NEGATIVE

## 2014-11-09 LAB — URINALYSIS, ROUTINE W REFLEX MICROSCOPIC
BILIRUBIN URINE: NEGATIVE
GLUCOSE, UA: NEGATIVE mg/dL
Ketones, ur: NEGATIVE mg/dL
Leukocytes, UA: NEGATIVE
NITRITE: NEGATIVE
Protein, ur: 100 mg/dL — AB
Urobilinogen, UA: 1 mg/dL (ref 0.0–1.0)
pH: 6 (ref 5.0–8.0)

## 2014-11-09 LAB — URINE MICROSCOPIC-ADD ON

## 2014-11-09 LAB — BRAIN NATRIURETIC PEPTIDE: B NATRIURETIC PEPTIDE 5: 1226 pg/mL — AB (ref 0.0–100.0)

## 2014-11-09 LAB — GLUCOSE, CAPILLARY
GLUCOSE-CAPILLARY: 115 mg/dL — AB (ref 65–99)
Glucose-Capillary: 135 mg/dL — ABNORMAL HIGH (ref 65–99)

## 2014-11-09 LAB — CBG MONITORING, ED: GLUCOSE-CAPILLARY: 126 mg/dL — AB (ref 65–99)

## 2014-11-09 LAB — MAGNESIUM: Magnesium: 2.2 mg/dL (ref 1.7–2.4)

## 2014-11-09 LAB — I-STAT TROPONIN, ED: TROPONIN I, POC: 0.07 ng/mL (ref 0.00–0.08)

## 2014-11-09 LAB — TSH: TSH: 1.885 u[IU]/mL (ref 0.350–4.500)

## 2014-11-09 LAB — AMMONIA: Ammonia: 38 umol/L — ABNORMAL HIGH (ref 9–35)

## 2014-11-09 LAB — PROTIME-INR
INR: 1.2 (ref 0.00–1.49)
Prothrombin Time: 15.4 seconds — ABNORMAL HIGH (ref 11.6–15.2)

## 2014-11-09 LAB — ALKALINE PHOSPHATASE: Alkaline Phosphatase: 94 U/L (ref 38–126)

## 2014-11-09 LAB — I-STAT CG4 LACTIC ACID, ED: Lactic Acid, Venous: 6.98 mmol/L (ref 0.5–2.0)

## 2014-11-09 LAB — PHOSPHORUS: PHOSPHORUS: 4 mg/dL (ref 2.5–4.6)

## 2014-11-09 MED ORDER — MIDAZOLAM HCL 2 MG/2ML IJ SOLN: 1.0000 mg | INTRAMUSCULAR | Status: DC | PRN

## 2014-11-09 MED ORDER — SODIUM CHLORIDE 0.9 % IV SOLN
INTRAVENOUS | Status: DC
  Administered 2014-11-09: 17:00:00 via INTRAVENOUS

## 2014-11-09 MED ORDER — ONDANSETRON HCL 4 MG/2ML IJ SOLN: 4.0000 mg | Freq: Four times a day (QID) | INTRAMUSCULAR | Status: DC | PRN

## 2014-11-09 MED ORDER — FENTANYL BOLUS VIA INFUSION
25.0000 ug | INTRAVENOUS | Status: DC | PRN
  Filled 2014-11-09: qty 25

## 2014-11-09 MED ORDER — FENTANYL CITRATE (PF) 100 MCG/2ML IJ SOLN: 50.0000 ug | INTRAMUSCULAR | Status: DC | PRN

## 2014-11-09 MED ORDER — SODIUM CHLORIDE 0.9 % IV SOLN: INTRAVENOUS | Status: DC

## 2014-11-09 MED ORDER — ASPIRIN 81 MG PO CHEW: 324.0000 mg | CHEWABLE_TABLET | ORAL | Status: AC

## 2014-11-09 MED ORDER — EPINEPHRINE HCL 0.1 MG/ML IJ SOSY
PREFILLED_SYRINGE | INTRAMUSCULAR | Status: AC | PRN
  Administered 2014-11-09 (×2): 1 mg via INTRAVENOUS

## 2014-11-09 MED ORDER — SODIUM CHLORIDE 0.9 % IV SOLN
2000.0000 mL | Freq: Once | INTRAVENOUS | Status: AC
  Administered 2014-11-09: 1000 mL via INTRAVENOUS

## 2014-11-09 MED ORDER — SODIUM CHLORIDE 0.9 % IV SOLN
25.0000 ug/h | INTRAVENOUS | Status: DC
  Administered 2014-11-09: 100 ug/h via INTRAVENOUS
  Administered 2014-11-10 – 2014-11-11 (×2): 150 ug/h via INTRAVENOUS
  Filled 2014-11-09 (×3): qty 50

## 2014-11-09 MED ORDER — SODIUM CHLORIDE 0.9 % IV BOLUS (SEPSIS)
500.0000 mL | Freq: Once | INTRAVENOUS | Status: AC
  Administered 2014-11-09: 500 mL via INTRAVENOUS

## 2014-11-09 MED ORDER — MIDAZOLAM HCL 5 MG/ML IJ SOLN
1.0000 mg/h | INTRAMUSCULAR | Status: DC
  Administered 2014-11-10: 1 mg/h via INTRAVENOUS
  Filled 2014-11-09 (×3): qty 10

## 2014-11-09 MED ORDER — ASPIRIN 300 MG RE SUPP
300.0000 mg | RECTAL | Status: AC
  Administered 2014-11-09: 300 mg via RECTAL
  Filled 2014-11-09: qty 1

## 2014-11-09 MED ORDER — LACTULOSE 10 GM/15ML PO SOLN
20.0000 g | Freq: Once | ORAL | Status: AC
  Administered 2014-11-09: 20 g via ORAL
  Filled 2014-11-09: qty 30

## 2014-11-09 MED ORDER — CHLORHEXIDINE GLUCONATE 0.12 % MT SOLN
15.0000 mL | Freq: Two times a day (BID) | OROMUCOSAL | Status: DC
  Administered 2014-11-10 (×2): 15 mL via OROMUCOSAL
  Filled 2014-11-09 (×3): qty 15

## 2014-11-09 MED ORDER — SODIUM CHLORIDE 0.9 % IV SOLN
INTRAVENOUS | Status: AC | PRN
  Administered 2014-11-09: 1000 mL via INTRAVENOUS

## 2014-11-09 MED ORDER — SODIUM CHLORIDE 0.9 % IV SOLN: 250.0000 mL | INTRAVENOUS | Status: DC | PRN

## 2014-11-09 MED ORDER — FAMOTIDINE 40 MG/5ML PO SUSR
20.0000 mg | Freq: Two times a day (BID) | ORAL | Status: DC
  Administered 2014-11-09: 20 mg
  Filled 2014-11-09 (×3): qty 2.5

## 2014-11-09 MED ORDER — SODIUM CHLORIDE 0.9 % IV BOLUS (SEPSIS)
1000.0000 mL | Freq: Once | INTRAVENOUS | Status: AC
  Administered 2014-11-09: 1000 mL via INTRAVENOUS

## 2014-11-09 MED ORDER — ALBUTEROL SULFATE (2.5 MG/3ML) 0.083% IN NEBU
2.5000 mg | INHALATION_SOLUTION | RESPIRATORY_TRACT | Status: DC
  Administered 2014-11-09: 2.5 mg via RESPIRATORY_TRACT
  Filled 2014-11-09: qty 3

## 2014-11-09 MED ORDER — DEXMEDETOMIDINE HCL IN NACL 200 MCG/50ML IV SOLN
0.0000 ug/kg/h | INTRAVENOUS | Status: DC
  Filled 2014-11-09: qty 50

## 2014-11-09 MED ORDER — ACETAMINOPHEN 325 MG PO TABS: 650.0000 mg | ORAL_TABLET | ORAL | Status: DC | PRN

## 2014-11-09 MED ORDER — INSULIN ASPART 100 UNIT/ML ~~LOC~~ SOLN
2.0000 [IU] | SUBCUTANEOUS | Status: DC
  Administered 2014-11-09: 2 [IU] via SUBCUTANEOUS

## 2014-11-09 MED ORDER — MIDAZOLAM HCL 2 MG/2ML IJ SOLN
1.0000 mg | INTRAMUSCULAR | Status: AC | PRN
  Administered 2014-11-09 (×3): 1 mg via INTRAVENOUS
  Filled 2014-11-09 (×3): qty 2

## 2014-11-09 MED ORDER — SODIUM CHLORIDE 0.9 % IV SOLN
INTRAVENOUS | Status: DC
  Administered 2014-11-09: 19:00:00 via INTRAVENOUS

## 2014-11-09 MED ORDER — CETYLPYRIDINIUM CHLORIDE 0.05 % MT LIQD
7.0000 mL | Freq: Four times a day (QID) | OROMUCOSAL | Status: DC
  Administered 2014-11-10 – 2014-11-11 (×5): 7 mL via OROMUCOSAL

## 2014-11-09 MED ORDER — FENTANYL CITRATE (PF) 100 MCG/2ML IJ SOLN
50.0000 ug | Freq: Once | INTRAMUSCULAR | Status: AC
  Administered 2014-11-09: 50 ug via INTRAVENOUS

## 2014-11-09 MED ORDER — ENOXAPARIN SODIUM 30 MG/0.3ML ~~LOC~~ SOLN
30.0000 mg | SUBCUTANEOUS | Status: DC
  Administered 2014-11-09: 30 mg via SUBCUTANEOUS
  Filled 2014-11-09 (×2): qty 0.3

## 2014-11-09 MED ORDER — SODIUM BICARBONATE 8.4 % IV SOLN
INTRAVENOUS | Status: AC | PRN
  Administered 2014-11-09: 50 meq via INTRAVENOUS

## 2014-11-09 NOTE — Code Documentation (Signed)
Pt noted to be in organized rhythm on monitor and pulse check revealed strong femoral pulse.  CPR discontinued at this time.

## 2014-11-09 NOTE — Progress Notes (Signed)
eLink Physician-Brief Progress Note Patient Name: Gabriella DumasDoretha K Fry DOB: 07/29/1931 MRN: [id number]   Date of Service  11/25/2014  HPI/Events of Note  hyperkalemia  eICU Interventions  Lactulose.      Intervention Category Intermediate Interventions: Electrolyte abnormality - evaluation and management  Shane Crutchradeep Phala Schraeder 11/07/2014, 9:17 PM

## 2014-11-09 NOTE — Code Documentation (Signed)
Pt arrived to ED via private vehicle after episode of difficulty breathing after which pt became unresponsive.  Unsure of how long pt was unresponsive PTA. Compressions initiated upon arrival to ED.  Pt found to be in asystole after being placed on monitor.  Code began.

## 2014-11-09 NOTE — ED Provider Notes (Signed)
CSN: [id number]     Arrival date & time 11/24/14  1526 History   First MD Initiated Contact with Patient 24-Nov-2014 1543     Chief Complaint  Patient presents with  . Cardiac Arrest     The history is provided by a caregiver. The history is limited by the condition of the patient (unresponsive).  Pt was seen at 1525. Per pt's driver/facility staff: States he was driving pt to various appointments today (labs, XR, doctor's office) when they stopped for a meal at a fast food restaurant, then started on their way to a doctor's office appointment. He states shortly after pt ate the meal, she "looked like she was having trouble breathing." He denies pt was choking or gagging. Pt then became unresponsive and "stopped breathing." Staff states he drove pt to the ED. Unknown time of unresponsiveness. ED staff removed pt from private vehicle unresponsive, apneic and pulseless.     Past Medical History  Diagnosis Date  . COPD (chronic obstructive pulmonary disease)   . Hypertension   . Schizophrenia   . Anxiety   . Delusions    Past Surgical History  Procedure Laterality Date  . Total hip arthroplasty Left 2010    History  Substance Use Topics  . Smoking status: Current Every Day Smoker  . Smokeless tobacco: Not on file  . Alcohol Use: No    Review of Systems  Unable to perform ROS: Patient unresponsive      Allergies  Review of patient's allergies indicates no known allergies.  Home Medications   Prior to Admission medications   Medication Sig Start Date End Date Taking? Authorizing Provider  haloperidol (HALDOL) 5 MG tablet Take 5 mg by mouth 2 (two) times daily. 11/04/14   Historical Provider, MD  naproxen (NAPROSYN) 500 MG tablet Take 500 mg by mouth 2 (two) times daily. 11/08/14   Historical Provider, MD  PARoxetine (PAXIL) 20 MG tablet Take 20 mg by mouth daily. 11/04/14   Historical Provider, MD  PROAIR HFA 108 (90 BASE) MCG/ACT inhaler Inhale 2 puffs into the lungs every 6 (six)  hours as needed. 24-Nov-2014   Historical Provider, MD  triamterene-hydrochlorothiazide (MAXZIDE-25) 37.5-25 MG per tablet Take 1 tablet by mouth daily. 11/04/14   Historical Provider, MD  verapamil (CALAN-SR) 240 MG CR tablet Take 240 mg by mouth daily. 11/04/14   Historical Provider, MD   BP 126/61 mmHg  Pulse 77  Resp 15  SpO2 100%   Filed Vitals:   11/24/14 1600 11/24/14 1615 11-24-2014 1630 November 24, 2014 1656  BP: 126/61 102/70 96/55 99/56   Pulse: 77 68 68 66  Resp: 15 15 15 16   SpO2: 100% 96% 96% 100%    Physical Exam  1525: Physical examination: Vital signs and O2 SAT: Reviewed; Constitutional: Well developed, Well nourished, Well hydrated, Unresponsive; Head and Face: Normocephalic, Atraumatic; Eyes: Pupils 3mm bilat, sluggish; ENMT:  Mucous membranes moist; Neck: No lymphadenopathy, No crepitus, Trachea midline; Cardiovascular: Heart sounds absent, Pulses absent; Respiratory: Breath sounds clear & equal bilaterally, Apnea, Bag-valve-mask ventilated; Chest: No deformity, Movement normal, No crepitus; Abdomen: Soft, Nondistended; Extremities: No deformity, +1 pedal edema bilat. No calf asymmetry.; Neuro: Unresponsive, GCS 3.; Skin: Color normal, Dry, Cool   ED Course  Procedures    Cardiopulmonary Resuscitation (CPR) Procedure Note Directed/Performed by: Laray Anger I personally directed ancillary staff and/or performed CPR in an effort to regain return of spontaneous circulation and to maintain cardiac, neuro and systemic perfusion.    Airway procedure:  Timeout:  Pre-procedure timeout not performed due to emergent nature of procedure; Indication: Unresponsive;  Oxygen Saturation: 100 %; Oxygen concentration: 100 %; Preoxygenation: Bag-valve-mask;  Medication: none; Procedure: Suctioning of small amount of foodstuff in posterior pharynx. No gag. Glidescope laryngoscopy, Endotracheal intubation with 7.185mm cuffed endotracheal tube, Bag-valve-tube ventilation, Mechanical ventilation;   Reassessment: Successful intubation, No bleeding or obvious trauma in oral cavity or posterior pharynx. Breath sounds equal bilaterally, No breath sounds heard over stomach, Chest movement symmetrical, CO2 detector color change, Endotracheal tube fogging, Oxygen saturation normal. Post-procedure xray obtained.       EKG Interpretation   Date/Time:  Tuesday November 09 2014 15:40:42 EDT Ventricular Rate:  64 PR Interval:  243 QRS Duration: 76 QT Interval:  446 QTC Calculation: 460 R Axis:   96 Text Interpretation:  Sinus rhythm with 1st degree A-V block Atrial  premature complex Probable lateral infarct, age indeterminate Anteroseptal  infarct, age indeterminate Nonspecific ST and T wave abnormality When  compared with ECG of 07/07/2008 Nonspecific ST and T wave abnormality is  now Present PR interval has increased Confirmed by [facility]ANUS  MD, Nicholos JohnsKATHLEEN  [phone] on 06-01-14 4:51:24 PM      MDM  MDM Reviewed: previous chart, nursing note and vitals Reviewed previous: labs and ECG Interpretation: labs, ECG and x-ray Total time providing critical care: 30-74 minutes. This excludes time spent performing separately reportable procedures and services. Consults: admitting MD and critical care     CRITICAL CARE Performed by: Laray AngerMCMANUS,Sedonia Kitner M Total critical care time: 45 Critical care time was exclusive of separately billable procedures and treating other patients. Critical care was necessary to treat or prevent imminent or life-threatening deterioration. Critical care was time spent personally by me on the following activities: development of treatment plan with patient and/or surrogate as well as nursing, discussions with consultants, evaluation of patient's response to treatment, examination of patient, obtaining history from patient or surrogate, ordering and performing treatments and interventions, ordering and review of laboratory studies, ordering and review of radiographic studies,  pulse oximetry and re-evaluation of patient's condition.  Results for orders placed or performed during the hospital encounter of 07/22/2014  Comprehensive metabolic panel  Result Value Ref Range   Sodium 145 135 - 145 mmol/L   Potassium 4.8 3.5 - 5.1 mmol/L   Chloride 105 101 - 111 mmol/L   CO2 26 22 - 32 mmol/L   Glucose, Bld 187 (H) 65 - 99 mg/dL   BUN 45 (H) 6 - 20 mg/dL   Creatinine, Ser 4.692.46 (H) 0.44 - 1.00 mg/dL   Calcium 8.1 (L) 8.9 - 10.3 mg/dL   Total Protein 6.4 (L) 6.5 - 8.1 g/dL   Albumin 3.6 3.5 - 5.0 g/dL   AST 629119 (H) 15 - 41 U/L   ALT 119 (H) 14 - 54 U/L   Alkaline Phosphatase 100 38 - 126 U/L   Total Bilirubin 0.8 0.3 - 1.2 mg/dL   GFR calc non Af Amer 17 (L) >60 mL/min   GFR calc Af Amer 20 (L) >60 mL/min   Anion gap 14 5 - 15  Protime-INR  Result Value Ref Range   Prothrombin Time 15.4 (H) 11.6 - 15.2 seconds   INR 1.20 0.00 - 1.49  CBC with Differential  Result Value Ref Range   WBC 9.0 4.0 - 10.5 K/uL   RBC 3.72 (L) 3.87 - 5.11 MIL/uL   Hemoglobin 10.8 (L) 12.0 - 15.0 g/dL   HCT 52.836.2 41.336.0 - 24.446.0 %   MCV 97.3 78.0 -  100.0 fL   MCH 29.0 26.0 - 34.0 pg   MCHC 29.8 (L) 30.0 - 36.0 g/dL   RDW 62.1 (H) 30.8 - 65.7 %   Platelets 186 150 - 400 K/uL   Neutrophils Relative % 68 43 - 77 %   Neutro Abs 6.1 1.7 - 7.7 K/uL   Lymphocytes Relative 24 12 - 46 %   Lymphs Abs 2.2 0.7 - 4.0 K/uL   Monocytes Relative 8 3 - 12 %   Monocytes Absolute 0.7 0.1 - 1.0 K/uL   Eosinophils Relative 0 0 - 5 %   Eosinophils Absolute 0.0 0.0 - 0.7 K/uL   Basophils Relative 0 0 - 1 %   Basophils Absolute 0.0 0.0 - 0.1 K/uL  Blood gas, arterial  Result Value Ref Range   FIO2 100.00 %   Delivery systems VENTILATOR    Mode PRESSURE REGULATED VOLUME CONTROL    VT 500 mL   Rate 15 resp/min   Peep/cpap 5.0 cm H20   pH, Arterial 7.192 (LL) 7.350 - 7.450   pCO2 arterial 67.7 (HH) 35.0 - 45.0 mmHg   pO2, Arterial 224 (H) 80.0 - 100.0 mmHg   Bicarbonate 24.9 (H) 20.0 - 24.0 mEq/L    TCO2 24.1 0 - 100 mmol/L   Acid-base deficit 2.3 (H) 0.0 - 2.0 mmol/L   O2 Saturation 98.9 %   Patient temperature 37.0    Collection site RIGHT RADIAL    Drawn by [id number]    Sample type ARTERIAL    Allens test (pass/fail) PASS PASS  CBG monitoring, ED  Result Value Ref Range   Glucose-Capillary 126 (H) 65 - 99 mg/dL  I-stat Chem 8, ED  Result Value Ref Range   Sodium 146 (H) 135 - 145 mmol/L   Potassium 4.7 3.5 - 5.1 mmol/L   Chloride 105 101 - 111 mmol/L   BUN 43 (H) 6 - 20 mg/dL   Creatinine, Ser 9.52 (H) 0.44 - 1.00 mg/dL   Glucose, Bld 841 (H) 65 - 99 mg/dL   Calcium, Ion 3.24 (L) 1.13 - 1.30 mmol/L   TCO2 26 0 - 100 mmol/L   Hemoglobin 12.6 12.0 - 15.0 g/dL   HCT 40.1 02.7 - 25.3 %  I-stat troponin, ED  Result Value Ref Range   Troponin i, poc 0.07 0.00 - 0.08 ng/mL   Comment 3          I-Stat CG4 Lactic Acid, ED  Result Value Ref Range   Lactic Acid, Venous 6.98 (HH) 0.5 - 2.0 mmol/L   Comment NOTIFIED PHYSICIAN     Dg Chest Portable 1 View 11/08/2014   CLINICAL DATA:  Hypoxia  EXAM: PORTABLE CHEST - 1 VIEW  COMPARISON:  November 09, 2014 study obtained earlier in the day  FINDINGS: Endotracheal tube tip is 4.4 cm above the carina. Nasogastric tube tip and side port are in the stomach. No pneumothorax. There is no edema or consolidation. Heart is slightly enlarged with pulmonary vascularity within normal limits. No adenopathy.  IMPRESSION: Tube positions as described without pneumothorax. Stable cardiac prominence. No edema or consolidation.   Electronically Signed   By: Bretta Bang III M.D.   On: 11/08/2014 16:04     1525:  Pt arrived by private vehicle: unresponsive, apneic, pulseless. Asystole on monitor. CPR/BVM immediately started.   1535:  CPR continued. IV epi x2 given. Pt intubated without incident. IV bicarb given. +ROSC. Monitor with NSR/sinus tachycardia, strong central pulses. Remains apneic, GCS 3. No acute STEMI  on EKG.   1625:  ARF on labs with elevated  lactic acid; IVF boluses continue. Pt's POA/Guardian is a Clinical research associate; call has been placed. No family that facility is aware of or who have come to the ED. T/C to [facility] Konrad Dolores, case discussed, including:  HPI, pertinent PM/SHx, VS/PE, dx testing, ED course and treatment:  Requests to call PCCM at [facility] to see if they want her admitted there on their service vs [facility]. T/C to [facility] P[facility] Dr. Nicholos Johns, case discussed, including:  HPI, pertinent PM/SHx, VS/PE, dx testing, ED course and treatment:  Agreeable to admit, does not want [place] to start to cool pt, requests to write temporary orders, obtain ICU bed. [facility] states no truck available until after 1900; will call local EMS to transport.    Samuel Jester, DO 11/16/14 1258

## 2014-11-09 NOTE — ED Notes (Signed)
Pt had labs done this morning per driver from assisted living.  Became unresponsive in car and was in cardiac arrest on arrival to ED.  Code began.

## 2014-11-09 NOTE — H&P (Signed)
PULMONARY / CRITICAL CARE MEDICINE   Name: [name] MRN: [id number] DOB: [birth year]    ADMISSION DATE:  11/08/2014 CONSULTATION DATE:  11/26/2014  REFERRING MD :  [facility] ED  CHIEF COMPLAINT:  Arrest  INITIAL PRESENTATION: 81 year old female presented to [facility] ED unresponsive 7/12. Was found to be in cardiac arrest and ACLS was initiated. Approximately 15 mins of downtime prior to, and 8 mins arrest after the initiation of CPR. Transferred to [facility] for ICU admission and potential therapeutic hypothermia.    STUDIES:  7/12 CT head > 7/12 LE dopplers >   SIGNIFICANT EVENTS: 7/12 cardiac arrest, to [facility] ICU, normothermia therapy.    HISTORY OF PRESENT ILLNESS:  81 year old female with PMH as below, who presented to [facility] ED 7/12 in asystole. She resides in SNF and has a state appointed legal guardian due to distant family members.  From SNF she was being transported to various medical appointments today. The driver took them to a fast food restaurant for lunch, and shortly after they ate and were back in the [place], he noticed that she was having trouble breathing. He does not believe she was choking on food.This progressed until she stopped breathing altogether and became unresponsive. He drove her directly to ED. He estimates time from unresponsiveness to ED presentation was about 15 minutes. Asystole was noted in ED and CPR was initiated. She received 2 rounds to epi and bicarb prior to ROSC, this process was about 8 minutes. Total estimated downtime is around 23-25 minutes. Troponin was wnl and EKG was without acute ischemic change. She was transferred to [facility] ED for consideration of induced hypothermia.  PAST MEDICAL HISTORY :   has a past medical history of COPD (chronic obstructive pulmonary disease); Hypertension; Schizophrenia; Anxiety; and Delusions.  has past surgical history that includes Total hip arthroplasty (Left, 2010). Prior to Admission medications   Medication  Sig Start Date End Date Taking? Authorizing Provider  haloperidol (HALDOL) 5 MG tablet Take 5 mg by mouth 2 (two) times daily. 11/04/14  Yes Historical Provider, MD  naproxen (NAPROSYN) 500 MG tablet Take 500 mg by mouth 2 (two) times daily. 11/08/14  Yes Historical Provider, MD  PARoxetine (PAXIL) 20 MG tablet Take 20 mg by mouth daily. 11/04/14  Yes Historical Provider, MD  triamterene-hydrochlorothiazide (MAXZIDE-25) 37.5-25 MG per tablet Take 1 tablet by mouth daily. 11/04/14  Yes Historical Provider, MD  verapamil (CALAN-SR) 240 MG CR tablet Take 240 mg by mouth daily. 11/04/14  Yes Historical Provider, MD  PROAIR HFA 108 (90 BASE) MCG/ACT inhaler Inhale 2 puffs into the lungs every 6 (six) hours as needed for wheezing or shortness of breath.  10/29/2014   Historical Provider, MD   No Known Allergies  FAMILY HISTORY:  has no family status information on file.  SOCIAL HISTORY:  reports that she has been smoking.  She does not have any smokeless tobacco history on file. She reports that she does not drink alcohol or use illicit drugs.  REVIEW OF SYSTEMS:  unable  SUBJECTIVE:   VITAL SIGNS: Temp:  [98.6 F (37 C)-98.9 F (37.2 C)] 98.6 F (37 C) (07/12 1846) Pulse Rate:  [66-77] 76 (07/12 1913) Resp:  [15-22] 22 (07/12 1913) BP: (96-163)/(55-80) 145/55 mmHg (07/12 1846) SpO2:  [96 %-100 %] 97 % (07/12 1913) FiO2 (%):  [80 %-100 %] 80 % (07/12 1913) Weight:  [84.8 kg (186 lb 15.2 oz)] 84.8 kg (186 lb 15.2 oz) (07/12 1846)  HEMODYNAMICS:   VENTILATOR SETTINGS: Vent Mode:  [-] PRVC FiO2 (%):  [80 %-100 %] 80 % Set Rate:  [12 bmp-15 bmp] 12 bmp Vt Set:  [500 mL] 500 mL PEEP:  [5 cmH20] 5 cmH20 Plateau Pressure:  [30 cmH20-36 cmH20] 36 cmH20 INTAKE / OUTPUT: No intake or output data in the 24 hours ending 11/27/2014 1925  PHYSICAL EXAMINATION: General:  Elderly female, overweight, no NAD on vent Neuro:  Unresponsive on vent, intermittent jerking movements. Cough, gag, intact HEENT:  [place]/AT,  no JVD, PERRL Cardiovascular:  RRR, no MRG Lungs:  Bilateral rhonchi Abdomen:  Soft, non-distended Musculoskeletal:  No acute deformity, no edema Skin:  Grossly intact  LABS:  CBC  Recent Labs Lab 11/23/2014 1559 11/13/2014 1600  WBC  --  9.0  HGB 12.6 10.8*  HCT 37.0 36.2  PLT  --  186   Coag's  Recent Labs Lab 11/16/2014 1600  INR 1.20   BMET  Recent Labs Lab 11/28/2014 1559 11/18/2014 1600  NA 146* 145  K 4.7 4.8  CL 105 105  CO2  --  26  BUN 43* 45*  CREATININE 2.10* 2.46*  GLUCOSE 182* 187*   Electrolytes  Recent Labs Lab 11/18/2014 1600  CALCIUM 8.1*   Sepsis Markers  Recent Labs Lab 11/03/2014 1604  LATICACIDVEN 6.98*   ABG  Recent Labs Lab 11/26/2014 1555  PHART 7.192*  PCO2ART 67.7*  PO2ART 224*   Liver Enzymes  Recent Labs Lab 11/02/2014 1600  AST 119*  ALT 119*  ALKPHOS 100  BILITOT 0.8  ALBUMIN 3.6   Cardiac Enzymes No results for input(s): TROPONINI, PROBNP in the last 168 hours. Glucose  Recent Labs Lab 11/04/2014 1530 10/31/2014 1848  GLUCAP 126* 115*    Imaging Dg Chest 2 View  11/02/2014   CLINICAL DATA:  Increasing shortness of breath over the past 2 months, history of heavy tobacco use  EXAM: CHEST  2 VIEW  COMPARISON:  Portable chest x-ray of July 07, 2008 and PA and lateral chest x-ray of January 15, 2005.  FINDINGS: The lungs are well-expanded. There are new small bilateral pleural effusions blunting the costophrenic angles. The pulmonary interstitial markings are diffusely increased. The pulmonary vascularity is engorged and indistinct. The cardiac silhouette is enlarged. The mediastinum is normal in width. The bony thorax exhibits no acute abnormality. There is chronic wedge compression of the body of T7.  IMPRESSION: COPD. Congestive heart failure with pulmonary interstitial edema and small bilateral pleural effusions. The findings have developed since the March 2010 study.   Electronically Signed   By: David  Martinique M.D.    On: 11/17/2014 15:09   Dg Chest Portable 1 View  11/23/2014   CLINICAL DATA:  Hypoxia  EXAM: PORTABLE CHEST - 1 VIEW  COMPARISON:  November 09, 2014 study obtained earlier in the day  FINDINGS: Endotracheal tube tip is 4.4 cm above the carina. Nasogastric tube tip and side port are in the stomach. No pneumothorax. There is no edema or consolidation. Heart is slightly enlarged with pulmonary vascularity within normal limits. No adenopathy.  IMPRESSION: Tube positions as described without pneumothorax. Stable cardiac prominence. No edema or consolidation.   Electronically Signed   By: Lowella Grip III M.D.   On: 11/22/2014 16:04     ASSESSMENT / PLAN:  PULMONARY OETT 7/12 > A: Acute hypoxemic respiratory failure ? Pulmonary edema ? Pulmonary embolus  H/o COPD  P:   Full vent support Follow CXR ABG VAP bundle Scheduled BDs Doppler  legs (poor renal function for CTA)  CARDIOVASCULAR A:  S/p asystolic cardiac arrest Elevated lactic Hx HTN  P:  Telemetry monitoring  EKG Echo MAP goal > 65 mm/Hg (currently maintaining on own) NS @ 125/hr Trend lactic Trend troponin Initiate normothermia arctic sun protocol  RENAL A:   Acute kidney injury Hypocalcemia   P:   Serial Bmet Replace electrolytes as needed  GASTROINTESTINAL A:   Transaminitis, shock liver?  P:   NPO Trend LFT Amylase, lipase, alk phos Check ammonia Pepcid for SUP  HEMATOLOGIC A:   Anemia of chronic illness  P:  Follow CBC Transfuse per ICU guidelines SQ heparin for VTE ppx  INFECTIOUS A:   No evidence for infection  P:   Monitor clinically  ENDOCRINE A:   No acute issues  P:   CBG monitoring and SSI TSH  NEUROLOGIC A:  Acute anoxic encephalopathy  P:   RASS goal: -1 Fent gtt PRN versed CT head to r/o hemorrhage   FAMILY  - Updates: Spoke to legal POA who is state appointed Gordy [employer]vers [phone])  - Inter-disciplinary family meet or Palliative Care meeting due  by:  7/19  Georgann Housekeeper, AGACNP-BC [place] [facility]/Critical Care Pager [phone] or [phone]  11/01/2014 8:11 PM

## 2014-11-10 ENCOUNTER — Inpatient Hospital Stay (HOSPITAL_COMMUNITY): Payer: Medicare Other

## 2014-11-10 DIAGNOSIS — J9690 Respiratory failure, unspecified, unspecified whether with hypoxia or hypercapnia: Secondary | ICD-10-CM | POA: Insufficient documentation

## 2014-11-10 DIAGNOSIS — J9600 Acute respiratory failure, unspecified whether with hypoxia or hypercapnia: Secondary | ICD-10-CM

## 2014-11-10 DIAGNOSIS — R569 Unspecified convulsions: Secondary | ICD-10-CM

## 2014-11-10 DIAGNOSIS — I469 Cardiac arrest, cause unspecified: Principal | ICD-10-CM

## 2014-11-10 DIAGNOSIS — N179 Acute kidney failure, unspecified: Secondary | ICD-10-CM

## 2014-11-10 DIAGNOSIS — R400 Somnolence: Secondary | ICD-10-CM

## 2014-11-10 DIAGNOSIS — E872 Acidosis, unspecified: Secondary | ICD-10-CM | POA: Insufficient documentation

## 2014-11-10 DIAGNOSIS — J988 Other specified respiratory disorders: Secondary | ICD-10-CM

## 2014-11-10 DIAGNOSIS — R4182 Altered mental status, unspecified: Secondary | ICD-10-CM | POA: Insufficient documentation

## 2014-11-10 DIAGNOSIS — G253 Myoclonus: Secondary | ICD-10-CM

## 2014-11-10 DIAGNOSIS — R40243 Glasgow coma scale score 3-8: Secondary | ICD-10-CM

## 2014-11-10 LAB — CBC
HEMATOCRIT: 35.5 % — AB (ref 36.0–46.0)
Hemoglobin: 10.5 g/dL — ABNORMAL LOW (ref 12.0–15.0)
MCH: 28.2 pg (ref 26.0–34.0)
MCHC: 29.6 g/dL — ABNORMAL LOW (ref 30.0–36.0)
MCV: 95.4 fL (ref 78.0–100.0)
PLATELETS: 155 10*3/uL (ref 150–400)
RBC: 3.72 MIL/uL — AB (ref 3.87–5.11)
RDW: 17.8 % — AB (ref 11.5–15.5)
WBC: 12.4 10*3/uL — AB (ref 4.0–10.5)

## 2014-11-10 LAB — GLUCOSE, CAPILLARY
GLUCOSE-CAPILLARY: 70 mg/dL (ref 65–99)
GLUCOSE-CAPILLARY: 74 mg/dL (ref 65–99)
Glucose-Capillary: 113 mg/dL — ABNORMAL HIGH (ref 65–99)
Glucose-Capillary: 81 mg/dL (ref 65–99)
Glucose-Capillary: 65 mg/dL (ref 65–99)
Glucose-Capillary: 74 mg/dL (ref 65–99)
Glucose-Capillary: 48 mg/dL — ABNORMAL LOW (ref 65–99)

## 2014-11-10 LAB — HEPATIC FUNCTION PANEL
ALBUMIN: 3 g/dL — AB (ref 3.5–5.0)
ALT: 103 U/L — ABNORMAL HIGH (ref 14–54)
AST: 82 U/L — ABNORMAL HIGH (ref 15–41)
Alkaline Phosphatase: 80 U/L (ref 38–126)
BILIRUBIN DIRECT: 0.2 mg/dL (ref 0.1–0.5)
BILIRUBIN INDIRECT: 0.5 mg/dL (ref 0.3–0.9)
BILIRUBIN TOTAL: 0.7 mg/dL (ref 0.3–1.2)
TOTAL PROTEIN: 5.5 g/dL — AB (ref 6.5–8.1)

## 2014-11-10 LAB — MAGNESIUM: Magnesium: 2.1 mg/dL (ref 1.7–2.4)

## 2014-11-10 LAB — BLOOD GAS, ARTERIAL
Acid-Base Excess: 1.5 mmol/L (ref 0.0–2.0)
Acid-Base Excess: 2.4 mmol/L — ABNORMAL HIGH (ref 0.0–2.0)
Bicarbonate: 28.1 mEq/L — ABNORMAL HIGH (ref 20.0–24.0)
Bicarbonate: 27.5 mEq/L — ABNORMAL HIGH (ref 20.0–24.0)
Drawn by: 39898
Drawn by: 398981
FIO2: 0.8 %
FIO2: 0.8 %
LHR: 20 {breaths}/min
MECHVT: 500 mL
O2 Saturation: 94.5 %
O2 Saturation: 97 %
PEEP/CPAP: 5 cmH2O
PEEP/CPAP: 5 cmH2O
PO2 ART: 83.1 mmHg (ref 80.0–100.0)
Patient temperature: 98.6
Patient temperature: 98.6
RATE: 12 resp/min
TCO2: 30.2 mmol/L (ref 0–100)
TCO2: 29.1 mmol/L (ref 0–100)
VT: 500 mL
pCO2 arterial: 67.6 mmHg (ref 35.0–45.0)
pCO2 arterial: 52 mmHg — ABNORMAL HIGH (ref 35.0–45.0)
pH, Arterial: 7.243 — ABNORMAL LOW (ref 7.350–7.450)
pH, Arterial: 7.344 — ABNORMAL LOW (ref 7.350–7.450)
pO2, Arterial: 91.3 mmHg (ref 80.0–100.0)

## 2014-11-10 LAB — BASIC METABOLIC PANEL
ANION GAP: 12 (ref 5–15)
BUN: 44 mg/dL — ABNORMAL HIGH (ref 6–20)
CO2: 22 mmol/L (ref 22–32)
Calcium: 7.9 mg/dL — ABNORMAL LOW (ref 8.9–10.3)
Chloride: 115 mmol/L — ABNORMAL HIGH (ref 101–111)
Creatinine, Ser: 1.7 mg/dL — ABNORMAL HIGH (ref 0.44–1.00)
GFR calc Af Amer: 32 mL/min — ABNORMAL LOW (ref >60)
GFR calc non Af Amer: 27 mL/min — ABNORMAL LOW (ref >60)
Glucose, Bld: 88 mg/dL (ref 65–99)
Potassium: 5.6 mmol/L — ABNORMAL HIGH (ref 3.5–5.1)
Sodium: 149 mmol/L — ABNORMAL HIGH (ref 135–145)

## 2014-11-10 LAB — PHOSPHORUS: PHOSPHORUS: 4.7 mg/dL — AB (ref 2.5–4.6)

## 2014-11-10 LAB — TROPONIN I
Troponin I: 0.06 ng/mL — ABNORMAL HIGH (ref <0.031)
Troponin I: 0.04 ng/mL — ABNORMAL HIGH (ref <0.031)

## 2014-11-10 MED ORDER — DEXTROSE 50 % IV SOLN
25.0000 mL | Freq: Once | INTRAVENOUS | Status: AC
  Administered 2014-11-10: 25 mL via INTRAVENOUS

## 2014-11-10 MED ORDER — ALBUTEROL SULFATE (2.5 MG/3ML) 0.083% IN NEBU
2.5000 mg | INHALATION_SOLUTION | Freq: Four times a day (QID) | RESPIRATORY_TRACT | Status: DC
  Administered 2014-11-10 – 2014-11-11 (×5): 2.5 mg via RESPIRATORY_TRACT
  Filled 2014-11-10 (×5): qty 3

## 2014-11-10 MED ORDER — SODIUM CHLORIDE 0.9 % IV SOLN
500.0000 mg | Freq: Two times a day (BID) | INTRAVENOUS | Status: DC
  Administered 2014-11-10 – 2014-11-11 (×4): 500 mg via INTRAVENOUS
  Filled 2014-11-10 (×5): qty 5

## 2014-11-10 MED ORDER — HEPARIN SODIUM (PORCINE) 5000 UNIT/ML IJ SOLN
5000.0000 [IU] | Freq: Three times a day (TID) | INTRAMUSCULAR | Status: DC
  Administered 2014-11-10 – 2014-11-11 (×2): 5000 [IU] via SUBCUTANEOUS
  Filled 2014-11-10 (×5): qty 1

## 2014-11-10 MED ORDER — SODIUM CHLORIDE 0.9 % IV SOLN: 500.0000 mg | Freq: Two times a day (BID) | INTRAVENOUS | Status: DC

## 2014-11-10 MED ORDER — PNEUMOCOCCAL VAC POLYVALENT 25 MCG/0.5ML IJ INJ: 0.5000 mL | INJECTION | INTRAMUSCULAR | Status: DC | PRN

## 2014-11-10 MED ORDER — DEXTROSE 50 % IV SOLN
INTRAVENOUS | Status: AC
  Filled 2014-11-10: qty 50

## 2014-11-10 MED ORDER — PIPERACILLIN-TAZOBACTAM 3.375 G IVPB
3.3750 g | Freq: Three times a day (TID) | INTRAVENOUS | Status: DC
  Administered 2014-11-10 – 2014-11-11 (×3): 3.375 g via INTRAVENOUS
  Filled 2014-11-10 (×5): qty 50

## 2014-11-10 MED ORDER — SODIUM CHLORIDE 0.9 % IV SOLN
1.0000 mg/h | INTRAVENOUS | Status: DC
  Administered 2014-11-10: 8 mg/h via INTRAVENOUS
  Administered 2014-11-10: 9 mg/h via INTRAVENOUS
  Administered 2014-11-11 (×2): 10 mg/h via INTRAVENOUS
  Filled 2014-11-10 (×3): qty 10

## 2014-11-10 MED ORDER — DEXTROSE 50 % IV SOLN
INTRAVENOUS | Status: AC
  Administered 2014-11-10: 50 mL via INTRAVENOUS
  Filled 2014-11-10: qty 50

## 2014-11-10 MED ORDER — DEXTROSE 10 % IV SOLN
INTRAVENOUS | Status: DC
  Administered 2014-11-10 (×2): via INTRAVENOUS

## 2014-11-10 MED ORDER — VITAL HIGH PROTEIN PO LIQD
1000.0000 mL | ORAL | Status: DC
  Filled 2014-11-10 (×2): qty 1000

## 2014-11-10 MED ORDER — DEXTROSE 50 % IV SOLN
25.0000 mL | Freq: Once | INTRAVENOUS | Status: AC
  Administered 2014-11-10: 50 mL via INTRAVENOUS
  Administered 2014-11-10: 25 mL via INTRAVENOUS

## 2014-11-10 MED ORDER — FAMOTIDINE 40 MG/5ML PO SUSR
20.0000 mg | Freq: Every day | ORAL | Status: DC
  Administered 2014-11-10: 20 mg
  Filled 2014-11-10 (×2): qty 2.5

## 2014-11-10 NOTE — Progress Notes (Signed)
EEG Completed; Results Pending  

## 2014-11-10 NOTE — Progress Notes (Addendum)
PULMONARY / CRITICAL CARE MEDICINE   Name: [name] MRN: [id number] DOB: 04/20/1932    ADMISSION DATE:  2014-10-01  REFERRING MD :  [facility]  CHIEF COMPLAINT:  Arrest  INITIAL PRESENTATION: 81 year old female [facility] 7/12. Was found to be in cardiac arrest and ACLS was initiated. Approximately 15 mins of downtime prior to, and 8 mins arrest after the initiation of CPR. [facility] and potential therapeutic hypothermia.    STUDIES:  7/12 portable CT head > atrophy, chronic microvascular ischemic disease 7/12 LE dopplers >   SIGNIFICANT EVENTS: 7/12 cardiac arrest, to [facility] ICU, normothermia therapy.  7/13 myoclonus, neuro consulted  SUBJECTIVE:  myoclonus.  VITAL SIGNS: Temp:  [96.3 F (35.7 C)-99 F (37.2 C)] 97.5 F (36.4 C) (07/13 0600) Pulse Rate:  [65-77] 70 (07/13 0724) Resp:  [12-25] 20 (07/13 0724) BP: (96-163)/(46-86) 144/69 mmHg (07/13 0724) SpO2:  [96 %-100 %] 100 % (07/13 0724) FiO2 (%):  [70 %-100 %] 70 % (07/13 0724) Weight:  [84.8 kg (186 lb 15.2 oz)-98.7 kg (217 lb 9.5 oz)] 98.7 kg (217 lb 9.5 oz) (07/13 0500) HEMODYNAMICS:   VENTILATOR SETTINGS: Vent Mode:  [-] PRVC FiO2 (%):  [70 %-100 %] 70 % Set Rate:  [12 bmp-20 bmp] 20 bmp Vt Set:  [500 mL] 500 mL PEEP:  [5 cmH20] 5 cmH20 Plateau Pressure:  [30 cmH20-36 cmH20] 35 cmH20 INTAKE / OUTPUT:  Intake/Output Summary (Last 24 hours) at 11/10/14 0934 Last data filed at 11/10/14 0600  Gross per 24 hour  Intake 1653.85 ml  Output    745 ml  Net 908.85 ml    PHYSICAL EXAMINATION: General: rass -5, myoclonus Neuro: comatose, perrm, int cough HEENT: ett, jvd wnl Cardiovascular: s1 s2 RRregular Lungs: CTA Abdomen:  Soft, non-distended, no r/g Musculoskeletal: no edema Skin: no edema  LABS:  CBC  Recent Labs Lab 09-23-2014 1600 09-23-2014 1940 11/10/14 0358  WBC 9.0 12.6* 12.4*  HGB 10.8* 11.2* 10.5*  HCT 36.2 36.2 35.5*   PLT 186 179 155   Coag's  Recent Labs Lab 09-23-2014 1600 09-23-2014 2107  APTT  --  23*  INR 1.20  --    BMET  Recent Labs Lab 09-23-2014 1600 09-23-2014 1940 09-23-2014 2107  NA 145 145 145  K 4.8 5.6* 4.7  CL 105 107 107  CO2 26 28 30   BUN 45* 45* 45*  CREATININE 2.46* 2.27* 2.30*  GLUCOSE 187* 138* 154*   Electrolytes  Recent Labs Lab 09-23-2014 1600 09-23-2014 1940 09-23-2014 2107 11/10/14 0358  CALCIUM 8.1* 8.1* 8.0*  --   MG  --  2.2  --  2.1  PHOS  --  4.0  --  4.7*   Sepsis Markers  Recent Labs Lab 09-23-2014 1604  LATICACIDVEN 6.98*   ABG  Recent Labs Lab 09-23-2014 2039 11/10/14 0337 11/10/14 0515  PHART 7.312* 7.243* 7.344*  PCO2ART 58.2* 67.6* 52.0*  PO2ART 119* 83.1 91.3   Liver Enzymes  Recent Labs Lab 09-23-2014 1600 09-23-2014 2107 11/10/14 0358  AST 119*  --  82*  ALT 119*  --  103*  ALKPHOS 100 94 80  BILITOT 0.8  --  0.7  ALBUMIN 3.6  --  3.0*   Cardiac Enzymes  Recent Labs Lab 09-23-2014 1940 11/10/14 0358  TROPONINI 0.08* 0.06*   Glucose  Recent Labs Lab 09-23-2014 1530 09-23-2014 1848 09-23-2014 2056 11/10/14 0117 11/10/14 0358  GLUCAP 126* 115* 135* 113*  81    Imaging Dg Chest 2 View  10/30/2014   CLINICAL DATA:  Increasing shortness of breath over the past 2 months, history of heavy tobacco use  EXAM: CHEST  2 VIEW  COMPARISON:  Portable chest x-ray of July 07, 2008 and PA and lateral chest x-ray of January 15, 2005.  FINDINGS: The lungs are well-expanded. There are new small bilateral pleural effusions blunting the costophrenic angles. The pulmonary interstitial markings are diffusely increased. The pulmonary vascularity is engorged and indistinct. The cardiac silhouette is enlarged. The mediastinum is normal in width. The bony thorax exhibits no acute abnormality. There is chronic wedge compression of the body of T7.  IMPRESSION: COPD. Congestive heart failure with pulmonary interstitial edema and small bilateral pleural  effusions. The findings have developed since the March 2010 study.   Electronically Signed   By: David  [place] M.D.   On: 11/15/2014 15:09   Dg Chest Port 1 View  11/10/2014   CLINICAL DATA:  Hypoxia  EXAM: PORTABLE CHEST - 1 VIEW  COMPARISON:  November 09, 2014  FINDINGS: Endotracheal tube tip is 4.1 cm above the carina. Nasogastric tube tip and side port are below the diaphragm. No pneumothorax. There is new patchy airspace consolidation in the right base with small right effusion. There is mild atelectasis in the left base. Heart is slightly enlarged with pulmonary vascularity within normal limits. No apparent adenopathy.  IMPRESSION: New focal airspace opacity right base with small right effusion. Mild left base atelectasis. Stable cardiac prominence. Tube positions as described without pneumothorax.   Electronically Signed   By: Bretta Bang III M.D.   On: 11/10/2014 07:05   Dg Chest Portable 1 View  11/10/2014   CLINICAL DATA:  Hypoxia  EXAM: PORTABLE CHEST - 1 VIEW  COMPARISON:  November 09, 2014 study obtained earlier in the day  FINDINGS: Endotracheal tube tip is 4.4 cm above the carina. Nasogastric tube tip and side port are in the stomach. No pneumothorax. There is no edema or consolidation. Heart is slightly enlarged with pulmonary vascularity within normal limits. No adenopathy.  IMPRESSION: Tube positions as described without pneumothorax. Stable cardiac prominence. No edema or consolidation.   Electronically Signed   By: Bretta Bang III M.D.   On: 11/01/2014 16:04   Ct Portable Head W/o Cm  11/10/2014   CLINICAL DATA:  81 year old female presenting to the emergency department with unresponsiveness.  EXAM: CT HEAD WITHOUT CONTRAST  TECHNIQUE: Contiguous axial images were obtained from the base of the skull through the vertex without intravenous contrast.  COMPARISON:  None.  FINDINGS: There is slight prominence of the ventricles and sulci compatible with age-related volume loss.  Periventricular and deep white matter hypodensities represent chronic microvascular ischemic changes. There is no intracranial hemorrhage. No mass effect or midline shift identified.  The visualized paranasal sinuses and mastoid air cells are well aerated. The calvarium is intact. Bilateral periorbital swelling.  Endotracheal and enteric tube are partially visualized.  IMPRESSION: No acute intracranial pathology.  Age-related atrophy and chronic microvascular ischemic disease.  If symptoms persist and there are no contraindications, MRI may provide better evaluation if clinically indicated.   Electronically Signed   By: Elgie Collard M.D.   On: 11/10/2014 01:50     ASSESSMENT / PLAN:  PULMONARY ETT 7/12 > A: Acute hypoxemic/hypercapnic respiratory failure in setting of cardiac arrest. H/o COPD. ASP likely  P:   ABg reviewed, keep same MV pcxr noted new rt base infiltrate, asp likely,  see ID Peep to 8 if unable to get to 50% pcxr in am   CARDIOVASCULAR A:  S/p asystolic cardiac arrest with approximately 25 minutes for ROSC >> ?cause. Hx HTN. P:  F/u Echo, doppler legs Pressors would not change outcome Normothermia protocol - consider dc this, will not change outcome and futile  RENAL A:   Acute kidney injury >> not sure what baseline renal fx is., likely ATn post arrest Hypocalcemia. Lactic acidosis post arrest P:   LA poor neuro prognostication bmet now Avoid free water avid gross overlaod, lower rate  GASTROINTESTINAL A:   Elevated LFTs in setting of cardiac arrest. P:   F/u LFTs Pepcid for SUP Start tf   HEMATOLOGIC A:   Anemia of chronic illness. P:  Follow CBC Lovenox for DVT prevention - dc with crt clearance Add sub q hep  INFECTIOUS A:   Asp event likley P:   Monitor clinically, add zosyn assess sputum  ENDOCRINE A:   No acute issues. P:   CBG monitoring and SSI, dc insulin, glu is low Repeat LFT TSH 1.88 May need glu in  fluids  NEUROLOGIC A:  Acute anoxic encephalopathy with myoclonus. Poor prgnosis P:   RASS goal: 0 Keppra per neurology F/u EEG today Versed for myoclonic, may need depakote  Goals of Care: Pt is ward of state due to lack of family contact, but has multiple family members.  POA contact info >> Marella Chimes [phone]).  - Inter-disciplinary family meet or Palliative Care meeting due by:  7/19   Will call POA , consider comfort care CC time 45  min  Mcarthur Rossetti. Tyson Alias, MD, FACP Pgr: [phone] [facility]]

## 2014-11-10 NOTE — Progress Notes (Signed)
eLink Physician-Brief Progress Note Patient Name: Gabriella DumasDoretha K Slivka DOB: 11/23/1931 MRN: [id number]   Date of Service  11/10/2014  HPI/Events of Note  ABG - acute resp acidosis  eICU Interventions  Increase RR to 20     Intervention Category Major Interventions: Acid-Base disturbance - evaluation and management  Lashe Oliveira V. 11/10/2014, 3:50 AM

## 2014-11-10 NOTE — Progress Notes (Signed)
CRITICAL VALUE ALERT  Critical value received:  ABG  Date of notification:  11/10/14  Time of notification:  0340  Critical value read back:Yes.    Nurse who received alert:  Alycia Rossettiyan  MD notified (1st page):  E-Link MD  Time of first page:  [phone]   Responding MD: E-Link MD  Time MD responded:  [phone]

## 2014-11-10 NOTE — Consult Note (Signed)
                    NEURO HOSPITALIST CONSULT NOTE   Referring physician: Dr Alva Reason for Consult: myoclonic seizures  HPI:                                                                                                                                          [name] is an 82 y.o. female with a past medical history that is relevant for HTN, COPD, schizophrenia, presented to [facility] ED on 7/12 via private vehicle after episode of difficulty breathing after which pt became unresponsive. Was found to be in cardiac arrest and ACLS was initiated. Approximately 15 mins of downtime prior to, and 8 mins arrest after the initiation of CPR. Transferred to [facility] for ICU admission and potential therapeutic hypothermia.  She is currently intubated, on the vent, sedated with Fentanyl and midazolam drip. As per chart review " She resides in SNF and has a state appointed legal guardian due to distant family members. From SNF she was being transported to various medical appointments today. The driver took them to a fast food restaurant for lunch, and shortly after they ate and were back in the van, he noticed that she was having trouble breathing. He does not believe she was choking on food.This progressed until she stopped breathing altogether and became unresponsive. He drove her directly to ED. He estimates time from unresponsiveness to ED presentation was about 15 minutes. Asystole was noted in ED and CPR was initiated. She received 2 rounds to epi and bicarb prior to ROSC, this process was about 8 minutes. Total estimated downtime is around 23-25 minutes. Troponin was wnl and EKG was without acute ischemic change. She was transferred to [facility] ED for consideration of induced hypothermia". Earlier today, patient noted to have paroxysmal hyperkinetic movements characterized by repetitive, short lasting, intermittent flexion and posturing of the whole body, raising both arms, initially stimulus induced but  also noted to occur spontaneously.  She is being loaded with IV keppra and is on midazolam infusion. CT brain was personally reviewed and showed no acute abnormality. She is on normothermia protocol. Serologies significant for Cr 2.30, GFR 22, AST 119. ALT 119, lactic acid 6.98    Past Medical History  Diagnosis Date  . COPD (chronic obstructive pulmonary disease)   . Hypertension   . Schizophrenia   . Anxiety   . Delusions     Past Surgical History  Procedure Laterality Date  . Total hip arthroplasty Left 2010    History reviewed. No pertinent family history.  Family History: unable to obtain due to mental status   Social History:  reports that she has been smoking.  She does not have any smokeless tobacco history on file. She reports that she does not drink alcohol or use illicit drugs.  No Known   Allergies  MEDICATIONS:                                                                                                                     Scheduled: . albuterol  2.5 mg Nebulization Q6H  . antiseptic oral rinse  7 mL Mouth Rinse QID  . chlorhexidine  15 mL Mouth Rinse BID  . enoxaparin (LOVENOX) injection  30 mg Subcutaneous Q24H  . famotidine  20 mg Per Tube BID  . insulin aspart  2-6 Units Subcutaneous 6 times per day  . levETIRAcetam  500 mg Intravenous Q12H     ROS: unable to obtain due to mental status                                                                                                                                     History obtained from chart review   Physical exam: intubated on the vent. Midazolam and Fentanyl turned off for this exam. Blood pressure 121/51, pulse 66, temperature 96.6 F (35.9 C), temperature source Core (Comment), resp. rate 18, height 5' 4" (1.626 m), weight 98.3 kg (216 lb 11.4 oz), SpO2 100 %. Head: normocephalic. Neck: supple, no bruits, no JVD. Cardiac: no murmurs. Lungs: clear. Abdomen: soft, no tender, no  mass. Extremities: no edema. Skin: no rash  Neurologic Examination:                                                                                                      Off sedation for this exam. Mental status: unresponsive. CN 2-12: pupils 2 mm, very poorly react to light. EOM absent on Doll's maneuver. Couldn't elicit corneal reflexes. Face seems to be symmetric. Motor: no spontaneous or pain induced motor activity.  Sensory: no reaction to pain. DTR's: upgoing Coordination and gait: unable to test.  No results found for: CHOL  Results for orders placed or performed during the hospital encounter of 11/15/2014 (from the past 48 hour(s))  CBG monitoring, ED     Status:  Abnormal   Collection Time: 10/30/2014  3:30 PM  Result Value Ref Range   Glucose-Capillary 126 (H) 65 - 99 mg/dL  Blood gas, arterial     Status: Abnormal   Collection Time: 11/24/2014  3:55 PM  Result Value Ref Range   FIO2 100.00 %   Delivery systems VENTILATOR    Mode PRESSURE REGULATED VOLUME CONTROL    VT 500 mL   Rate 15 resp/min   Peep/cpap 5.0 cm H20   pH, Arterial 7.192 (LL) 7.350 - 7.450    Comment: CRITICAL RESULT CALLED TO, READ BACK BY AND VERIFIED WITH: VOGLER,T.RN CRITICAL RESULT CALLED TO, READ BACK BY AND VERIFIED WITH: VOGLER,T.RN AT 1607 11/04/2014 BY BROADNAX,L.RRT    pCO2 arterial 67.7 (HH) 35.0 - 45.0 mmHg    Comment: CRITICAL RESULT CALLED TO, READ BACK BY AND VERIFIED WITH: VOGLER,T.RN AT 1607 11/27/2014 BY BROADNAX,L.RRT    pO2, Arterial 224 (H) 80.0 - 100.0 mmHg   Bicarbonate 24.9 (H) 20.0 - 24.0 mEq/L   TCO2 24.1 0 - 100 mmol/L   Acid-base deficit 2.3 (H) 0.0 - 2.0 mmol/L   O2 Saturation 98.9 %   Patient temperature 37.0    Collection site RIGHT RADIAL    Drawn by [id number]    Sample type ARTERIAL    Allens test (pass/fail) PASS PASS  I-stat troponin, ED     Status: None   Collection Time: 11/02/2014  3:56 PM  Result Value Ref Range   Troponin i, poc 0.07 0.00 - 0.08 ng/mL   Comment 3             Comment: Due to the release kinetics of cTnI, a negative result within the first hours of the onset of symptoms does not rule out myocardial infarction with certainty. If myocardial infarction is still suspected, repeat the test at appropriate intervals.   I-stat Chem 8, ED     Status: Abnormal   Collection Time: 10/31/2014  3:59 PM  Result Value Ref Range   Sodium 146 (H) 135 - 145 mmol/L   Potassium 4.7 3.5 - 5.1 mmol/L   Chloride 105 101 - 111 mmol/L   BUN 43 (H) 6 - 20 mg/dL   Creatinine, Ser 2.10 (H) 0.44 - 1.00 mg/dL   Glucose, Bld 182 (H) 65 - 99 mg/dL   Calcium, Ion 1.09 (L) 1.13 - 1.30 mmol/L   TCO2 26 0 - 100 mmol/L   Hemoglobin 12.6 12.0 - 15.0 g/dL   HCT 37.0 36.0 - 46.0 %  Comprehensive metabolic panel     Status: Abnormal   Collection Time: 11/03/2014  4:00 PM  Result Value Ref Range   Sodium 145 135 - 145 mmol/L   Potassium 4.8 3.5 - 5.1 mmol/L   Chloride 105 101 - 111 mmol/L   CO2 26 22 - 32 mmol/L   Glucose, Bld 187 (H) 65 - 99 mg/dL   BUN 45 (H) 6 - 20 mg/dL   Creatinine, Ser 2.46 (H) 0.44 - 1.00 mg/dL   Calcium 8.1 (L) 8.9 - 10.3 mg/dL   Total Protein 6.4 (L) 6.5 - 8.1 g/dL   Albumin 3.6 3.5 - 5.0 g/dL   AST 119 (H) 15 - 41 U/L   ALT 119 (H) 14 - 54 U/L   Alkaline Phosphatase 100 38 - 126 U/L   Total Bilirubin 0.8 0.3 - 1.2 mg/dL   GFR calc non Af Amer 17 (L) >60 mL/min   GFR calc Af Amer 20 (L) >60 mL/min  Comment: (NOTE) The eGFR has been calculated using the CKD EPI equation. This calculation has not been validated in all clinical situations. eGFR's persistently <60 mL/min signify possible Chronic Kidney Disease.    Anion gap 14 5 - 15  Brain natriuretic peptide     Status: Abnormal   Collection Time: 11/16/2014  4:00 PM  Result Value Ref Range   B Natriuretic Peptide 1226.0 (H) 0.0 - 100.0 pg/mL  Protime-INR     Status: Abnormal   Collection Time: 11/18/2014  4:00 PM  Result Value Ref Range   Prothrombin Time 15.4 (H) 11.6 - 15.2 seconds    INR 1.20 0.00 - 1.49  CBC with Differential     Status: Abnormal   Collection Time: 11/07/2014  4:00 PM  Result Value Ref Range   WBC 9.0 4.0 - 10.5 K/uL   RBC 3.72 (L) 3.87 - 5.11 MIL/uL   Hemoglobin 10.8 (L) 12.0 - 15.0 g/dL   HCT 36.2 36.0 - 46.0 %   MCV 97.3 78.0 - 100.0 fL   MCH 29.0 26.0 - 34.0 pg   MCHC 29.8 (L) 30.0 - 36.0 g/dL   RDW 17.8 (H) 11.5 - 15.5 %   Platelets 186 150 - 400 K/uL   Neutrophils Relative % 68 43 - 77 %   Neutro Abs 6.1 1.7 - 7.7 K/uL   Lymphocytes Relative 24 12 - 46 %   Lymphs Abs 2.2 0.7 - 4.0 K/uL   Monocytes Relative 8 3 - 12 %   Monocytes Absolute 0.7 0.1 - 1.0 K/uL   Eosinophils Relative 0 0 - 5 %   Eosinophils Absolute 0.0 0.0 - 0.7 K/uL   Basophils Relative 0 0 - 1 %   Basophils Absolute 0.0 0.0 - 0.1 K/uL  I-Stat CG4 Lactic Acid, ED     Status: Abnormal   Collection Time: 11/07/2014  4:04 PM  Result Value Ref Range   Lactic Acid, Venous 6.98 (HH) 0.5 - 2.0 mmol/L   Comment NOTIFIED PHYSICIAN   Urinalysis, Routine w reflex microscopic (not at [facility])     Status: Abnormal   Collection Time: 11/01/2014  4:50 PM  Result Value Ref Range   Color, Urine YELLOW YELLOW   APPearance HAZY (A) CLEAR   Specific Gravity, Urine >1.030 (H) 1.005 - 1.030   pH 6.0 5.0 - 8.0   Glucose, UA NEGATIVE NEGATIVE mg/dL   Hgb urine dipstick MODERATE (A) NEGATIVE   Bilirubin Urine NEGATIVE NEGATIVE   Ketones, ur NEGATIVE NEGATIVE mg/dL   Protein, ur 100 (A) NEGATIVE mg/dL   Urobilinogen, UA 1.0 0.0 - 1.0 mg/dL   Nitrite NEGATIVE NEGATIVE   Leukocytes, UA NEGATIVE NEGATIVE  Urine microscopic-add on     Status: Abnormal   Collection Time: 11/15/2014  4:50 PM  Result Value Ref Range   Squamous Epithelial / LPF FEW (A) RARE   WBC, UA 21-50 <3 WBC/hpf   RBC / HPF 11-20 <3 RBC/hpf   Bacteria, UA MANY (A) RARE   Casts GRANULAR CAST (A) NEGATIVE  MRSA PCR Screening     Status: None   Collection Time: 11/28/2014  6:42 PM  Result Value Ref Range   MRSA by PCR NEGATIVE  NEGATIVE    Comment:        The GeneXpert MRSA Assay (FDA approved for NASAL specimens only), is one component of a comprehensive MRSA colonization surveillance program. It is not intended to diagnose MRSA infection nor to guide or monitor treatment for MRSA infections.  Glucose, capillary     Status: Abnormal   Collection Time: 11/07/2014  6:48 PM  Result Value Ref Range   Glucose-Capillary 115 (H) 65 - 99 mg/dL   Comment 1 Capillary Specimen   Basic metabolic panel     Status: Abnormal   Collection Time: 11/12/2014  7:40 PM  Result Value Ref Range   Sodium 145 135 - 145 mmol/L   Potassium 5.6 (H) 3.5 - 5.1 mmol/L   Chloride 107 101 - 111 mmol/L   CO2 28 22 - 32 mmol/L   Glucose, Bld 138 (H) 65 - 99 mg/dL   BUN 45 (H) 6 - 20 mg/dL   Creatinine, Ser 2.27 (H) 0.44 - 1.00 mg/dL   Calcium 8.1 (L) 8.9 - 10.3 mg/dL   GFR calc non Af Amer 19 (L) >60 mL/min   GFR calc Af Amer 22 (L) >60 mL/min    Comment: (NOTE) The eGFR has been calculated using the CKD EPI equation. This calculation has not been validated in all clinical situations. eGFR's persistently <60 mL/min signify possible Chronic Kidney Disease.    Anion gap 10 5 - 15  Magnesium     Status: None   Collection Time: 11/19/2014  7:40 PM  Result Value Ref Range   Magnesium 2.2 1.7 - 2.4 mg/dL  Phosphorus     Status: None   Collection Time: 11/16/2014  7:40 PM  Result Value Ref Range   Phosphorus 4.0 2.5 - 4.6 mg/dL  Lipase, blood     Status: Abnormal   Collection Time: 11/17/2014  7:40 PM  Result Value Ref Range   Lipase 12 (L) 22 - 51 U/L  Troponin I     Status: Abnormal   Collection Time:   7:40 PM  Result Value Ref Range   Troponin I 0.08 (H) <0.031 ng/mL    Comment:        PERSISTENTLY INCREASED TROPONIN VALUES IN THE RANGE OF 0.04-0.49 ng/mL CAN BE SEEN IN:       -UNSTABLE ANGINA       -CONGESTIVE HEART FAILURE       -MYOCARDITIS       -CHEST TRAUMA       -ARRYHTHMIAS       -LATE PRESENTING MYOCARDIAL  INFARCTION       -COPD   CLINICAL FOLLOW-UP RECOMMENDED.   CBC WITH DIFFERENTIAL     Status: Abnormal   Collection Time: 11/10/2014  7:40 PM  Result Value Ref Range   WBC 12.6 (H) 4.0 - 10.5 K/uL   RBC 3.84 (L) 3.87 - 5.11 MIL/uL   Hemoglobin 11.2 (L) 12.0 - 15.0 g/dL   HCT 36.2 36.0 - 46.0 %   MCV 94.3 78.0 - 100.0 fL   MCH 29.2 26.0 - 34.0 pg   MCHC 30.9 30.0 - 36.0 g/dL   RDW 17.6 (H) 11.5 - 15.5 %   Platelets 179 150 - 400 K/uL   Neutrophils Relative % 86 (H) 43 - 77 %   Neutro Abs 10.8 (H) 1.7 - 7.7 K/uL   Lymphocytes Relative 4 (L) 12 - 46 %   Lymphs Abs 0.6 (L) 0.7 - 4.0 K/uL   Monocytes Relative 10 3 - 12 %   Monocytes Absolute 1.3 (H) 0.1 - 1.0 K/uL   Eosinophils Relative 0 0 - 5 %   Eosinophils Absolute 0.0 0.0 - 0.7 K/uL   Basophils Relative 0 0 - 1 %   Basophils Absolute 0.0 0.0 - 0.1 K/uL  Arterial Blood   Gas     Status: Abnormal   Collection Time: 11/15/2014  8:39 PM  Result Value Ref Range   FIO2 0.80 %   Delivery systems VENTILATOR    Mode PRESSURE REGULATED VOLUME CONTROL    VT 500 mL   Rate 12 resp/min   Peep/cpap 5.0 cm H20   pH, Arterial 7.312 (L) 7.350 - 7.450   pCO2 arterial 58.2 (HH) 35.0 - 45.0 mmHg    Comment: CRITICAL RESULT CALLED TO, READ BACK BY AND VERIFIED WITH:  Fayrene Fearing, RN AT 2045, BY CHELSEA MCCOLLUM, RRT ON 11/10/2014    pO2, Arterial 119 (H) 80.0 - 100.0 mmHg   Bicarbonate 28.6 (H) 20.0 - 24.0 mEq/L   TCO2 30.3 0 - 100 mmol/L   Acid-Base Excess 2.9 (H) 0.0 - 2.0 mmol/L   O2 Saturation 98.4 %   Patient temperature 98.6    Collection site RIGHT RADIAL    Drawn by [phone]    Sample type ARTERIAL    Allens test (pass/fail) PASS PASS  Glucose, capillary     Status: Abnormal   Collection Time: 11/20/2014  8:56 PM  Result Value Ref Range   Glucose-Capillary 135 (H) 65 - 99 mg/dL   Comment 1 Capillary Specimen   Culture, blood (routine x 2)     Status: None (Preliminary result)   Collection Time: 11/12/2014  9:07 PM  Result Value Ref  Range   Specimen Description BLOOD RIGHT ANTECUBITAL    Special Requests IN PEDIATRIC BOTTLE 1CC    Culture PENDING    Report Status PENDING   Basic metabolic panel     Status: Abnormal   Collection Time: 11/10/2014  9:07 PM  Result Value Ref Range   Sodium 145 135 - 145 mmol/L   Potassium 4.7 3.5 - 5.1 mmol/L   Chloride 107 101 - 111 mmol/L   CO2 30 22 - 32 mmol/L   Glucose, Bld 154 (H) 65 - 99 mg/dL   BUN 45 (H) 6 - 20 mg/dL   Creatinine, Ser 2.30 (H) 0.44 - 1.00 mg/dL   Calcium 8.0 (L) 8.9 - 10.3 mg/dL   GFR calc non Af Amer 19 (L) >60 mL/min   GFR calc Af Amer 22 (L) >60 mL/min    Comment: (NOTE) The eGFR has been calculated using the CKD EPI equation. This calculation has not been validated in all clinical situations. eGFR's persistently <60 mL/min signify possible Chronic Kidney Disease.    Anion gap 8 5 - 15  APTT     Status: Abnormal   Collection Time: 11/19/2014  9:07 PM  Result Value Ref Range   aPTT 23 (L) 24 - 37 seconds  Ammonia     Status: Abnormal   Collection Time: 11/19/2014  9:07 PM  Result Value Ref Range   Ammonia 38 (H) 9 - 35 umol/L  Amylase     Status: None   Collection Time:   9:07 PM  Result Value Ref Range   Amylase 50 28 - 100 U/L  Alkaline phosphatase     Status: None   Collection Time: 11/06/2014  9:07 PM  Result Value Ref Range   Alkaline Phosphatase 94 38 - 126 U/L  TSH     Status: None   Collection Time: 11/04/2014  9:07 PM  Result Value Ref Range   TSH 1.885 0.350 - 4.500 uIU/mL  Glucose, capillary     Status: Abnormal   Collection Time: 11/10/14  1:17 AM  Result Value Ref Range  Glucose-Capillary 113 (H) 65 - 99 mg/dL   Comment 1 Capillary Specimen     Dg Chest 2 View  11/01/2014   CLINICAL DATA:  Increasing shortness of breath over the past 2 months, history of heavy tobacco use  EXAM: CHEST  2 VIEW  COMPARISON:  Portable chest x-ray of July 07, 2008 and PA and lateral chest x-ray of January 15, 2005.  FINDINGS: The lungs are  well-expanded. There are new small bilateral pleural effusions blunting the costophrenic angles. The pulmonary interstitial markings are diffusely increased. The pulmonary vascularity is engorged and indistinct. The cardiac silhouette is enlarged. The mediastinum is normal in width. The bony thorax exhibits no acute abnormality. There is chronic wedge compression of the body of T7.  IMPRESSION: COPD. Congestive heart failure with pulmonary interstitial edema and small bilateral pleural effusions. The findings have developed since the March 2010 study.   Electronically Signed   By: David  Martinique M.D.   On: 11/14/2014 15:09   Dg Chest Portable 1 View  11/02/2014   CLINICAL DATA:  Hypoxia  EXAM: PORTABLE CHEST - 1 VIEW  COMPARISON:  November 09, 2014 study obtained earlier in the day  FINDINGS: Endotracheal tube tip is 4.4 cm above the carina. Nasogastric tube tip and side port are in the stomach. No pneumothorax. There is no edema or consolidation. Heart is slightly enlarged with pulmonary vascularity within normal limits. No adenopathy.  IMPRESSION: Tube positions as described without pneumothorax. Stable cardiac prominence. No edema or consolidation.   Electronically Signed   By: Lowella Grip III M.D.   On: 11/21/2014 16:04   Ct Portable Head W/o Cm  11/10/2014   CLINICAL DATA:  81 year old female presenting to the emergency department with unresponsiveness.  EXAM: CT HEAD WITHOUT CONTRAST  TECHNIQUE: Contiguous axial images were obtained from the base of the skull through the vertex without intravenous contrast.  COMPARISON:  None.  FINDINGS: There is slight prominence of the ventricles and sulci compatible with age-related volume loss. Periventricular and deep white matter hypodensities represent chronic microvascular ischemic changes. There is no intracranial hemorrhage. No mass effect or midline shift identified.  The visualized paranasal sinuses and mastoid air cells are well aerated. The calvarium is  intact. Bilateral periorbital swelling.  Endotracheal and enteric tube are partially visualized.  IMPRESSION: No acute intracranial pathology.  Age-related atrophy and chronic microvascular ischemic disease.  If symptoms persist and there are no contraindications, MRI may provide better evaluation if clinically indicated.   Electronically Signed   By: Anner Crete M.D.   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 Loaded with 1 gram IV keppra. Will continue maintenance renal dose 500 mg BID. EEG. Will need follow up imaging, preferably MRI brain when feasible. Will continue to follow.  Dorian Pod, MD 11/10/2014, 2:10 AM  [facility]

## 2014-11-10 NOTE — Progress Notes (Signed)
ANTIBIOTIC CONSULT NOTE - INITIAL  Pharmacy Consult for Zosyn Indication: rule out pneumonia  No Known Allergies  Patient Measurements: Height: 5\' 4"  (162.6 cm) Weight: 217 lb 9.5 oz (98.7 kg) IBW/kg (Calculated) : 54.7  Vital Signs: Temp: 96.6 F (35.9 C) (07/13 0900) Temp Source: Core (Comment) (07/13 0900) BP: 140/65 mmHg (07/13 0900) Pulse Rate: 70 (07/13 0900) Intake/Output from previous day: 07/12 0701 - 07/13 0700 In: 1797.6 [I.V.:1692.6; IV Piggyback:105] Out: 745 [Urine:495; Emesis/NG output:250] Intake/Output from this shift: Total I/O In: 288 [I.V.:288] Out: 200 [Urine:200]  Labs:  Recent Labs  11/12/2014 1600 11/15/2014 1940 11/28/2014 2107 11/10/14 0358  WBC 9.0 12.6*  --  12.4*  HGB 10.8* 11.2*  --  10.5*  PLT 186 179  --  155  CREATININE 2.46* 2.27* 2.30*  --    Estimated Creatinine Clearance: 22.3 mL/min (by C-G formula based on Cr of 2.3). No results for input(s): VANCOTROUGH, VANCOPEAK, VANCORANDOM, GENTTROUGH, GENTPEAK, GENTRANDOM, TOBRATROUGH, TOBRAPEAK, TOBRARND, AMIKACINPEAK, AMIKACINTROU, AMIKACIN in the last 72 hours.   Microbiology: Recent Results (from the past 720 hour(s))  MRSA PCR Screening     Status: None   Collection Time: 11/05/2014  6:42 PM  Result Value Ref Range Status   MRSA by PCR NEGATIVE NEGATIVE Final    Comment:        The GeneXpert MRSA Assay (FDA approved for NASAL specimens only), is one component of a comprehensive MRSA colonization surveillance program. It is not intended to diagnose MRSA infection nor to guide or monitor treatment for MRSA infections.   Culture, blood (routine x 2)     Status: None (Preliminary result)   Collection Time: 10/31/2014  9:07 PM  Result Value Ref Range Status   Specimen Description BLOOD RIGHT ANTECUBITAL  Final   Special Requests IN PEDIATRIC BOTTLE 1CC  Final   Culture PENDING  Incomplete   Report Status PENDING  Incomplete    Medical History: Past Medical History  Diagnosis  Date  . COPD (chronic obstructive pulmonary disease)   . Hypertension   . Schizophrenia   . Anxiety   . Delusions    Assessment:81 yo F admitted 11/23/2014 via [facility] ED unresponsive, ACLS, 15 min downtime after progressive SOB durring day, stopped breathing.  PMH: lives at SNF with state appointed legal guardian, COPD, HTN, schizophrenia, anxiety, delusions   ID: empiric aspiration PNA after aystolic arrest, hypothermia (36 degree protocol) LA 6.98 7/13 Zosyn>>  Goal of Therapy:  Renal adjustment of antibiotics  Plan:  Zosyn 3.375g IV q8h infuse over 4h Follow up SCr, UOP, cultures, clinical course and adjust as clinically indicated. Thank you for allowing pharmacy to be a part of this patients care team.  Lovenia KimJulie Adrain Butrick Pharm.D., BCPS, AQ-Cardiology Clinical Pharmacist 11/10/2014 9:57 AM Pager: [phone] Phone: [phone]

## 2014-11-10 NOTE — Progress Notes (Signed)
eLink Physician-Brief Progress Note Patient Name: Gabriella DumasDoretha K Fry DOB: 03/12/1932 MRN: [id number]   Date of Service  11/10/2014  HPI/Events of Note  Myoclonus like activity onc amera -raising both a rms  eICU Interventions  keppra Head CT pending Neuro input Increase versed gtt      Intervention Category Major Interventions: Seizures - evaluation and management  ALVA,RAKESH V. 11/10/2014, 12:52 AM

## 2014-11-10 NOTE — Care Management Note (Signed)
Case Management Note  Patient Details  Name: [name] MRN: [id number] Date of Birth: 07/16/1931  Subjective/Objective:        Adm w cardiac arrest, vent            Action/Plan: from nsg facility   Expected Discharge Date:                  Expected Discharge Plan:  Skilled Nursing Facility  In-House Referral:  Clinical Social Work  Discharge planning Services     Post Acute Care Choice:    Choice offered to:     DME Arranged:    DME Agency:     HH Arranged:    HH Agency:     Status of Service:     Medicare Important Message Given:    Date Medicare IM Given:    Medicare IM give by:    Date Additional Medicare IM Given:    Additional Medicare Important Message give by:     If discussed at Long Length of Stay Meetings, dates discussed:    Additional Comments: ur review done  Hanley HaysDowell, Kamilla Hands T, RN 11/10/2014, 8:32 AM

## 2014-11-10 NOTE — Procedures (Signed)
ELECTROENCEPHALOGRAM REPORT   Patient: Gabriella DumasDoretha K Messerschmidt      Room #: 2H-04 Age: 81 y.o.        Sex: female Referring Physician: Dr Tyson AliasFeinstein Report Date:  11/10/2014        Interpreting Physician: Omelia BlackwaterSUMNER, Lucely Leard JUSTIN  History: Gabriella DumasDoretha K Bertagnolli is an [age] y.o. female admitted post cardiac arrest with around 20 min before ROSC. Noted myoclonic activity  Medications:  Continuous: . sodium chloride 50 mL/hr at 11/10/14 1205  . dextrose 50 mL/hr at 11/10/14 1218  . fentaNYL infusion INTRAVENOUS 150 mcg/hr (11/10/14 1205)  . midazolam (VERSED) infusion 6 mg/hr (11/10/14 1030)    Conditions of Recording:  This is a 16 channel EEG carried out with the patient in the sedated and intubated state.  Description:  The background activity consists of marked low voltage activity that is symmetrical. There is no noted reactivity to noxious stimuli (external nail bed pressure). Noted myoclonic posturing in the early portion of the reading, examination limited due to muscle artifact but this does not appear to have an electrographic correlate.   No focal slowing or epileptiform activity noted. Hyperventilation and intermittent photic stimulation not performed.     IMPRESSION: Abnormal EEG due to profound suppression of the background rhythm. This can be medication related or indicative of anoxic brain injury.    Elspeth Choeter Glendale Wherry, DO [facility] [phone]  If 7pm- 7am, please page neurology on call as listed in AMION. 11/10/2014, 12:41 PM

## 2014-11-10 NOTE — Progress Notes (Addendum)
Patient ID: Gabriella DumasDoretha K Ohmer, female   DOB: 06/12/1931, 81 y.o.   MRN: [id number] I have had extensive discussions with med POA MR MEdlin . We discussed patients current circumstances and organ failures. We also discussed patient's prior wishes under circumstances such as this. Family has decided to NOT perform resuscitation if arrest but to continue current medical support for now. No escalation  He is strongly considering comfort this evening.  Mcarthur Rossettianiel J. Tyson AliasFeinstein, MD, FACP Pgr: [phone] [place] [facility]

## 2014-11-10 NOTE — Progress Notes (Signed)
PULMONARY / CRITICAL CARE MEDICINE   Name: [name] MRN: [id number] DOB: 10/04/1931    ADMISSION DATE:  11/04/2014  [facility]  CHIEF COMPLAINT:  Arrest  INITIAL PRESENTATION: 81 year old female [facility] un[facility] 7/12. Was found to be in cardiac arrest and ACLS was initiated. Approximately 15 mins of downtime prior to, and 8 mins arrest after the initiation of CPR. [facility] and potential therapeutic hypothermia.    STUDIES:  7/12 portable CT head > atrophy, chronic microvascular ischemic disease 7/12 LE dopplers >   SIGNIFICANT EVENTS: 7/12 cardiac arrest, to [facility] ICU, normothermia therapy.  7/13 myoclonus, neuro consulted  SUBJECTIVE:  Noted to have myoclonus.  VITAL SIGNS: Temp:  [96.3 F (35.7 C)-99 F (37.2 C)] 96.6 F (35.9 C) (07/13 0200) Pulse Rate:  [65-77] 66 (07/13 0200) Resp:  [15-25] 18 (07/12 2314) BP: (96-163)/(46-80) 121/51 mmHg (07/13 0200) SpO2:  [96 %-100 %] 100 % (07/13 0200) FiO2 (%):  [80 %-100 %] 80 % (07/12 2314) Weight:  [186 lb 15.2 oz (84.8 kg)-216 lb 11.4 oz (98.3 kg)] 216 lb 11.4 oz (98.3 kg) (07/12 2000) HEMODYNAMICS:   VENTILATOR SETTINGS: Vent Mode:  [-] PRVC FiO2 (%):  [80 %-100 %] 80 % Set Rate:  [12 bmp-15 bmp] 12 bmp Vt Set:  [500 mL] 500 mL PEEP:  [5 cmH20] 5 cmH20 Plateau Pressure:  [30 cmH20-36 cmH20] 33 cmH20 INTAKE / OUTPUT:  Intake/Output Summary (Last 24 hours) at 11/10/14 0207 Last data filed at 11/10/14 0200  Gross per 24 hour  Intake 1081.85 ml  Output    200 ml  Net 881.85 ml    PHYSICAL EXAMINATION: General: off sedation Neuro: comatose HEENT: ETT in place Cardiovascular: regular Lungs: no wheeze Abdomen:  Soft, non-distended Musculoskeletal: no edema Skin: no edema  LABS:  CBC  Recent Labs Lab 11/22/2014 1559 11/25/2014 1600 11/20/2014 1940  WBC  --  9.0 12.6*  HGB 12.6 10.8* 11.2*  HCT 37.0 36.2 36.2  PLT  --  186 179    Coag's  Recent Labs Lab 11/28/2014 1600 11/03/2014 2107  APTT  --  23*  INR 1.20  --    BMET  Recent Labs Lab 11/01/2014 1600 11/10/2014 1940 11/28/2014 2107  NA 145 145 145  K 4.8 5.6* 4.7  CL 105 107 107  CO2 26 28 30   BUN 45* 45* 45*  CREATININE 2.46* 2.27* 2.30*  GLUCOSE 187* 138* 154*   Electrolytes  Recent Labs Lab 11/16/2014 1600 11/26/2014 1940 11/25/2014 2107  CALCIUM 8.1* 8.1* 8.0*  MG  --  2.2  --   PHOS  --  4.0  --    Sepsis Markers  Recent Labs Lab 11/18/2014 1604  LATICACIDVEN 6.98*   ABG  Recent Labs Lab 11/07/2014 1555 11/04/2014 2039  PHART 7.192* 7.312*  PCO2ART 67.7* 58.2*  PO2ART 224* 119*   Liver Enzymes  Recent Labs Lab 11/10/2014 1600 11/14/2014 2107  AST 119*  --   ALT 119*  --   ALKPHOS 100 94  BILITOT 0.8  --   ALBUMIN 3.6  --    Cardiac Enzymes  Recent Labs Lab 11/21/2014 1940  TROPONINI 0.08*   Glucose  Recent Labs Lab 10/29/2014 1530 10/29/2014 1848 11/06/2014 2056 11/10/14 0117  GLUCAP 126* 115* 135* 113*    Imaging Dg Chest 2 View  11/17/2014   CLINICAL DATA:  Increasing shortness of breath over the past 2 months,  history of heavy tobacco use  EXAM: CHEST  2 VIEW  COMPARISON:  Portable chest x-ray of July 07, 2008 and PA and lateral chest x-ray of January 15, 2005.  FINDINGS: The lungs are well-expanded. There are new small bilateral pleural effusions blunting the costophrenic angles. The pulmonary interstitial markings are diffusely increased. The pulmonary vascularity is engorged and indistinct. The cardiac silhouette is enlarged. The mediastinum is normal in width. The bony thorax exhibits no acute abnormality. There is chronic wedge compression of the body of T7.  IMPRESSION: COPD. Congestive heart failure with pulmonary interstitial edema and small bilateral pleural effusions. The findings have developed since the March 2010 study.   Electronically Signed   By: David  [place] M.D.   On: 10/29/2014 15:09   Dg Chest Portable  1 View  11/24/2014   CLINICAL DATA:  Hypoxia  EXAM: PORTABLE CHEST - 1 VIEW  COMPARISON:  November 09, 2014 study obtained earlier in the day  FINDINGS: Endotracheal tube tip is 4.4 cm above the carina. Nasogastric tube tip and side port are in the stomach. No pneumothorax. There is no edema or consolidation. Heart is slightly enlarged with pulmonary vascularity within normal limits. No adenopathy.  IMPRESSION: Tube positions as described without pneumothorax. Stable cardiac prominence. No edema or consolidation.   Electronically Signed   By: Bretta Bang III M.D.   On: 11/24/2014 16:04   Ct Portable Head W/o Cm  11/10/2014   CLINICAL DATA:  81 year old female presenting to the emergency department with unresponsiveness.  EXAM: CT HEAD WITHOUT CONTRAST  TECHNIQUE: Contiguous axial images were obtained from the base of the skull through the vertex without intravenous contrast.  COMPARISON:  None.  FINDINGS: There is slight prominence of the ventricles and sulci compatible with age-related volume loss. Periventricular and deep white matter hypodensities represent chronic microvascular ischemic changes. There is no intracranial hemorrhage. No mass effect or midline shift identified.  The visualized paranasal sinuses and mastoid air cells are well aerated. The calvarium is intact. Bilateral periorbital swelling.  Endotracheal and enteric tube are partially visualized.  IMPRESSION: No acute intracranial pathology.  Age-related atrophy and chronic microvascular ischemic disease.  If symptoms persist and there are no contraindications, MRI may provide better evaluation if clinically indicated.   Electronically Signed   By: Elgie Collard M.D.   On: 11/10/2014 01:50     ASSESSMENT / PLAN:  PULMONARY ETT 7/12 > A: Acute hypoxemic/hypercapnic respiratory failure in setting of cardiac arrest. H/o COPD. P:   Full vent support F/u CXR Scheduled BD's  CARDIOVASCULAR A:  S/p asystolic cardiac arrest with  approximately 25 minutes for ROSC >> ?cause. Hx HTN. P:  F/u Echo, doppler legs Defer cardiology assessment for now Monitor hemodynamics Normothermia protocol  RENAL A:   Acute kidney injury >> not sure what baseline renal fx is. Hypocalcemia. Lactic acidosis. P:   Monitor renal fx F/u electrolytes F/u lactic acid  GASTROINTESTINAL A:   Elevated LFTs in setting of cardiac arrest. P:   F/u LFTs Pepcid for SUP  HEMATOLOGIC A:   Anemia of chronic illness. P:  Follow CBC Lovenox for DVT prevention  INFECTIOUS A:   No evidence for infection. P:   Monitor clinically  ENDOCRINE A:   No acute issues. P:   CBG monitoring and SSI TSH  NEUROLOGIC A:  Acute anoxic encephalopathy with myoclonus. P:   RASS goal: 0 Keppra per neurology F/u EEG  Goals of Care: Pt is ward of state due to lack  of family contact, but has multiple family members.  POA contact info >> Marella Chimes [phone]).  - Inter-disciplinary family meet or Palliative Care meeting due by:  7/19   CC time 40 minutes.  D/w Dr. Cyril Mourning.  Coralyn Helling, MD [facility] Pulmonary[facility] 11/10/2014, 2:16 AM Pager:  [phone] After 3pm call: [phone]

## 2014-11-11 ENCOUNTER — Inpatient Hospital Stay (HOSPITAL_COMMUNITY): Payer: Medicare Other

## 2014-11-11 DIAGNOSIS — N170 Acute kidney failure with tubular necrosis: Secondary | ICD-10-CM

## 2014-11-11 DIAGNOSIS — R4020 Unspecified coma: Secondary | ICD-10-CM

## 2014-11-11 LAB — GLUCOSE, CAPILLARY
GLUCOSE-CAPILLARY: 63 mg/dL — AB (ref 65–99)
Glucose-Capillary: 110 mg/dL — ABNORMAL HIGH (ref 65–99)
Glucose-Capillary: 73 mg/dL (ref 65–99)
Glucose-Capillary: 84 mg/dL (ref 65–99)
Glucose-Capillary: 108 mg/dL — ABNORMAL HIGH (ref 65–99)

## 2014-11-11 LAB — COMPREHENSIVE METABOLIC PANEL
ALBUMIN: 2.6 g/dL — AB (ref 3.5–5.0)
ALK PHOS: 77 U/L (ref 38–126)
ALT: 75 U/L — AB (ref 14–54)
AST: 54 U/L — ABNORMAL HIGH (ref 15–41)
Anion gap: 10 (ref 5–15)
BUN: 38 mg/dL — AB (ref 6–20)
CALCIUM: 7.3 mg/dL — AB (ref 8.9–10.3)
CO2: 25 mmol/L (ref 22–32)
CREATININE: 2.09 mg/dL — AB (ref 0.44–1.00)
Chloride: 108 mmol/L (ref 101–111)
GFR, EST AFRICAN AMERICAN: 25 mL/min — AB (ref >60)
GFR, EST NON AFRICAN AMERICAN: 21 mL/min — AB (ref >60)
Glucose, Bld: 65 mg/dL (ref 65–99)
POTASSIUM: 4.6 mmol/L (ref 3.5–5.1)
SODIUM: 143 mmol/L (ref 135–145)
Total Bilirubin: 0.8 mg/dL (ref 0.3–1.2)
Total Protein: 5.1 g/dL — ABNORMAL LOW (ref 6.5–8.1)

## 2014-11-11 LAB — URINE CULTURE: Culture: NO GROWTH

## 2014-11-11 LAB — CBC WITH DIFFERENTIAL/PLATELET
Basophils Absolute: 0 10*3/uL (ref 0.0–0.1)
Basophils Relative: 0 % (ref 0–1)
EOS PCT: 0 % (ref 0–5)
Eosinophils Absolute: 0 10*3/uL (ref 0.0–0.7)
HCT: 31.6 % — ABNORMAL LOW (ref 36.0–46.0)
Hemoglobin: 9.5 g/dL — ABNORMAL LOW (ref 12.0–15.0)
LYMPHS PCT: 9 % — AB (ref 12–46)
Lymphs Abs: 0.9 10*3/uL (ref 0.7–4.0)
MCH: 28.3 pg (ref 26.0–34.0)
MCHC: 30.1 g/dL (ref 30.0–36.0)
MCV: 94 fL (ref 78.0–100.0)
MONO ABS: 1 10*3/uL (ref 0.1–1.0)
MONOS PCT: 9 % (ref 3–12)
NEUTROS ABS: 8.7 10*3/uL — AB (ref 1.7–7.7)
NEUTROS PCT: 82 % — AB (ref 43–77)
Platelets: 153 10*3/uL (ref 150–400)
RBC: 3.36 MIL/uL — ABNORMAL LOW (ref 3.87–5.11)
RDW: 18.7 % — ABNORMAL HIGH (ref 11.5–15.5)
WBC: 10.7 10*3/uL — ABNORMAL HIGH (ref 4.0–10.5)

## 2014-11-11 MED ORDER — MORPHINE BOLUS VIA INFUSION
5.0000 mg | INTRAVENOUS | Status: DC | PRN
  Filled 2014-11-11: qty 20

## 2014-11-11 MED ORDER — DEXTROSE 5 % IV SOLN
10.0000 mg/h | INTRAVENOUS | Status: DC
  Administered 2014-11-11: 10 mg/h via INTRAVENOUS
  Filled 2014-11-11: qty 10

## 2014-11-12 ENCOUNTER — Telehealth: Payer: Self-pay

## 2014-11-12 NOTE — Telephone Encounter (Signed)
On 11/12/2014 I received a death certificate from [facility] [employer] [employer]. The death certificate is for burial. The patient is a patient of Doctor Tyson AliasFeinstein. The death certificate will be [facility] for Doctor Sood to sign the death certificate due to Doctor Tyson AliasFeinstein being on vacation next week. On 11/12/2014 I received the death certificate back from Doctor MiccoSood. I got the death certificate ready for pickup. I called the funeral home to let them know the death certificate was ready for pickup.

## 2014-11-13 LAB — CULTURE, RESPIRATORY

## 2014-11-13 LAB — CULTURE, RESPIRATORY W GRAM STAIN

## 2014-11-14 LAB — CULTURE, BLOOD (ROUTINE X 2)
CULTURE: NO GROWTH
Culture: NO GROWTH

## 2014-11-29 NOTE — Progress Notes (Signed)
Wasted 35cc Versed, 245 cc Morphine, 190cc fentanyl, down the sink with Royal Hawthornheresa Oleary, RN  Eliane DecreeSmith, [place] Moore, RN

## 2014-11-29 NOTE — Progress Notes (Signed)
   11/10/2014 1000  Clinical Encounter Type  Visited With Patient and family together;Health care provider  Visit Type Initial;Spiritual support;Social support;Critical Care;Patient actively dying  Referral From Physician  Spiritual Encounters  Spiritual Needs Prayer;Grief support  Stress Factors  Family Stress Factors Loss   Chaplain was referred to patient via spiritual care consult. Chaplain arrived as the patient was being withdrawn from life-prolonging support and extubated. Chaplain introduced himself to the patient's family. One of the patient's family members is a Education officer, environmentalpastor and seems to be providing support to the rest of the family during this time. After patient was extubated, chaplain was asked to pray. Chaplain prayed with family. Family appears to be accepting of the situation and is supporting the patient during her last moments. Chaplain will continue to provide grief support as needed.  Tenzin Edelman, Tommi EmeryBlake R, Chaplain  10:05 AM

## 2014-11-29 NOTE — Progress Notes (Signed)
Pt has these intermittent shakes or spasms every 2-3 minutes that causes her peak airway pressure to ramp up. RN aware.

## 2014-11-29 NOTE — Discharge Summary (Addendum)
NAMESHALLON, YAKLIN NO.:  [id number]  MEDICAL RECORD NO.:  [id number]  LOCATION:  2H04C                        FACILITY:  [facility]  PHYSICIAN:  Nelda Bucks, MD DATE OF BIRTH:  Dec 13, 1931  DATE OF ADMISSION:  11/10/2014 DATE OF DISCHARGE:  November 14, 2014                              DISCHARGE SUMMARY   DEATH SUMMARY  This is an 81 year old female who presented to the [facility] Emergency Room, unresponsive on November 09, 2014, was found to be in cardiac arrest and ACLS was initiated.  Approximately 15 minutes of downtime prior to __________ after the cardiac arrest with initiation of CPR, transferred to the [facility], ICU admission, potentially for therapeutic hypothermia.  __________ head CT done on November 09, 2014, showed atrophy, chronic microvascular ischemic changes.  She was found on November 10, 2014, she has underlying normothermia, was found to have severe myoclonus.  Neurology was consulted.  The patient __________ with poor prognosis.  She was maintained on ventilating machine likely with aspiration event given history of COPD.  Echo was ordered.  She was suffering from acute kidney injury secondary to ATN from hypoperfusion status post cardiac arrest.  The patient __________ with a medical power of attorney, Maisie Fus __________ making decisions for her care, he was in communication with the family as well and the decision was made for comfort care.  Comfort care was provided and the patient expired.  FINAL DIAGNOSES UPON DEATH: 1. Severe anoxic brain injury. 2. Myoclonus secondary to #1. 3. Status post cardiac arrest. 4. Vent dependent respiratory failure. 5. Chronic obstructive pulmonary disease. 6. Aspiration pneumonia with the strep pneumonia 7. Aspiration PNA with enterobacter aerogenes     Nelda Bucks, MD     DJF/MEDQ  D:  11/24/2014  T:  11/25/2014  Job:  [id number]

## 2014-11-29 NOTE — Progress Notes (Addendum)
PULMONARY / CRITICAL CARE MEDICINE   Name: Reyne DumasDoretha K Schoenfeld MRN: [id number] DOB: 03/31/1932    ADMISSION DATE:  11/07/2014  [facility]  CHIEF COMPLAINT:  Arrest  INITIAL PRESENTATION: 81 year old [facility] 7/12. Was found to be in cardiac arrest and ACLS was initiated. Approximately 15 mins of downtime prior to, and 8 mins arrest after the initiation of CPR. [facility] and potential therapeutic hypothermia.    STUDIES:  7/12 portable CT head > atrophy, chronic microvascular ischemic disease 7/12 LE dopplers >   SIGNIFICANT EVENTS: 7/12 cardiac arrest, to [facility] ICU, normothermia therapy.  7/13 myoclonus, neuro consulted  SUBJECTIVE:  Myoclonus is present face, on high dose versed  VITAL SIGNS: Temp:  [96.6 F (35.9 C)-100.8 F (38.2 C)] 100.8 F (38.2 C) (07/14 0600) Pulse Rate:  [66-99] 73 (07/14 0600) Resp:  [20] 20 (07/14 0423) BP: (59-143)/(32-83) 94/34 mmHg (07/14 0600) SpO2:  [92 %-100 %] 100 % (07/14 0600) FiO2 (%):  [40 %-50 %] 40 % (07/14 0423) Weight:  [100.2 kg (220 lb 14.4 oz)] 100.2 kg (220 lb 14.4 oz) (07/14 0340) HEMODYNAMICS:   VENTILATOR SETTINGS: Vent Mode:  [-] PRVC FiO2 (%):  [40 %-50 %] 40 % Set Rate:  [20 bmp] 20 bmp Vt Set:  [500 mL] 500 mL PEEP:  [5 cmH20] 5 cmH20 Plateau Pressure:  [19 cmH20-33 cmH20] 19 cmH20 INTAKE / OUTPUT:  Intake/Output Summary (Last 24 hours) at 04/28/2015 0807 Last data filed at 04/28/2015 0600  Gross per 24 hour  Intake 2206.74 ml  Output    700 ml  Net 1506.74 ml    PHYSICAL EXAMINATION: General: rass -5, myoclonus Neuro: comatose, per sluggish, moves arms upward intermittent to pain, posture HEENT: ett, jvd wnl Cardiovascular: s1 s2 RRregular Lungs: Coarse Abdomen:  Soft, non-distended, no r/g Musculoskeletal: no edema Skin: no edema  LABS:  CBC  Recent Labs Lab 11/26/2014 1940 11/10/14 0358 04/28/2015 0256  WBC 12.6* 12.4*  10.7*  HGB 11.2* 10.5* 9.5*  HCT 36.2 35.5* 31.6*  PLT 179 155 153   Coag's  Recent Labs Lab 11/24/2014 1600 11/21/2014 2107  APTT  --  23*  INR 1.20  --    BMET  Recent Labs Lab 11/10/2014 2107 11/10/14 1020 04/28/2015 0256  NA 145 149* 143  K 4.7 5.6* 4.6  CL 107 115* 108  CO2 30 22 25   BUN 45* 44* 38*  CREATININE 2.30* 1.70* 2.09*  GLUCOSE 154* 88 65   Electrolytes  Recent Labs Lab 11/13/2014 1940 11/26/2014 2107 11/10/14 0358 11/10/14 1020 04/28/2015 0256  CALCIUM 8.1* 8.0*  --  7.9* 7.3*  MG 2.2  --  2.1  --   --   PHOS 4.0  --  4.7*  --   --    Sepsis Markers  Recent Labs Lab 10/29/2014 1604  LATICACIDVEN 6.98*   ABG  Recent Labs Lab 11/28/2014 2039 11/10/14 0337 11/10/14 0515  PHART 7.312* 7.243* 7.344*  PCO2ART 58.2* 67.6* 52.0*  PO2ART 119* 83.1 91.3   Liver Enzymes  Recent Labs Lab 11/03/2014 1600 11/18/2014 2107 11/10/14 0358 04/28/2015 0256  AST 119*  --  82* 54*  ALT 119*  --  103* 75*  ALKPHOS 100 94 80 77  BILITOT 0.8  --  0.7 0.8  ALBUMIN 3.6  --  3.0* 2.6*   Cardiac Enzymes  Recent Labs Lab 11/24/2014 1940 11/10/14 0358 11/10/14 1020  TROPONINI  0.08* 0.06* 0.04*   Glucose  Recent Labs Lab 11/10/14 1440 11/10/14 1827 11/10/14 1950 11/10/14 2020 11/10/14 2330 2014-11-29 0345  GLUCAP 70 74 63* 110* 73 84    Imaging Dg Chest Port 1 View  11/29/2014   CLINICAL DATA:  Respiratory failure  EXAM: PORTABLE CHEST - 1 VIEW  COMPARISON:  11/10/2014  FINDINGS: Endotracheal tube in good position.  NG tube enters the stomach  Right lower lobe airspace disease unchanged. Progression of left lower lobe airspace disease. No significant pleural effusion. No pneumothorax  IMPRESSION: Endotracheal tube remains in good position  Progression of left lower lobe airspace disease. Right lower lobe airspace disease unchanged. Findings may represent pulmonary edema or pneumonia.   Electronically Signed   By: Marlan Palau M.D.   On: Nov 29, 2014 07:01      ASSESSMENT / PLAN:  PULMONARY ETT 7/12 > A: Acute hypoxemic/hypercapnic respiratory failure in setting of cardiac arrest. H/o COPD. ASP likely  P:   Move to morphine titration when family arrives Then assess cpap 5 ps 5, 30 seconds to titrate morphine needs  CARDIOVASCULAR A:  S/p asystolic cardiac arrest with approximately 25 minutes for ROSC >> ?cause. Hx HTN. P:  Echo was done, no report generated as of now Dc tele when comfort care started No role pressors and not wished from med poa  RENAL A:   Acute kidney injury >> not sure what baseline renal fx is., likely ATn post arrest Hypocalcemia. Lactic acidosis post arrest P:   bmet noted No repeat planned kvo  GASTROINTESTINAL A:   Elevated LFTs in setting of cardiac arrest. P:   TF to hold ppi to dc  HEMATOLOGIC A:   Anemia of chronic illness. P:  Sub q hep  INFECTIOUS A:   Asp event likley P:   zosyn  ENDOCRINE A:   Hypoglycemia P:   d10 in fluids to remain  NEUROLOGIC A:  Acute anoxic encephalopathy with myoclonus - severe Poor prgnosis P:   RASS goal: 0 Keppra ok for comfort Versed for myoclonic at 10 mg, maintain, may need Depakote Family to move to comfort care, neuro can sign off, appreciate there assessments Transition from fent to morphine   Goals of Care: Pt is ward of state due to lack of family contact, but has multiple family members.  POA contact info >> Marella Chimes [phone]). Agree to comfort care this am   - Inter-disciplinary family meet or Palliative Care meeting due by:  7/19   CC time 30  min  Mcarthur Rossetti. Tyson Alias, MD, FACP Pgr: [phone] [place] [facility]]

## 2014-11-29 NOTE — Procedures (Signed)
Extubation Procedure Note  Patient Details:   Name: Reyne DumasDoretha K Jentsch DOB: 05/15/1931 MRN: [id number]   Airway Documentation:  Airway 7.5 mm (Active)  Secured at (cm) 25 cm 12/06/2014  8:21 AM  Measured From Lips 12/06/2014  8:21 AM  Secured Location Left 12/06/2014  8:21 AM  Secured By [employer] Tube Holder 12/06/2014  8:21 AM  Tube Holder Repositioned Yes 12/06/2014  8:21 AM  Cuff Pressure (cm H2O) 24 cm H2O 12/06/2014  8:21 AM  Site Condition Dry 12/06/2014  8:21 AM    Evaluation  O2 sats: transiently fell during during procedure Complications: Complications of apnea, do to terminal extubation Patient did not tolerate procedure well. Bilateral Breath Sounds: Rhonchi Suctioning: Airway, Nasal Yes  Patient extubated to room air per MD order  Morley KosJohnson, Weslynn Ke Leroy 12/06/2014, 10:00 AM

## 2014-11-29 DEATH — deceased

## 2016-10-06 IMAGING — CR DG CHEST 1V PORT
1 series · 1 of 1 positions shown · non-contrast
Comparison: November 09, 2014 study obtained earlier in the day

CLINICAL DATA: Hypoxia

EXAM:
PORTABLE CHEST - 1 VIEW

[ap portable]
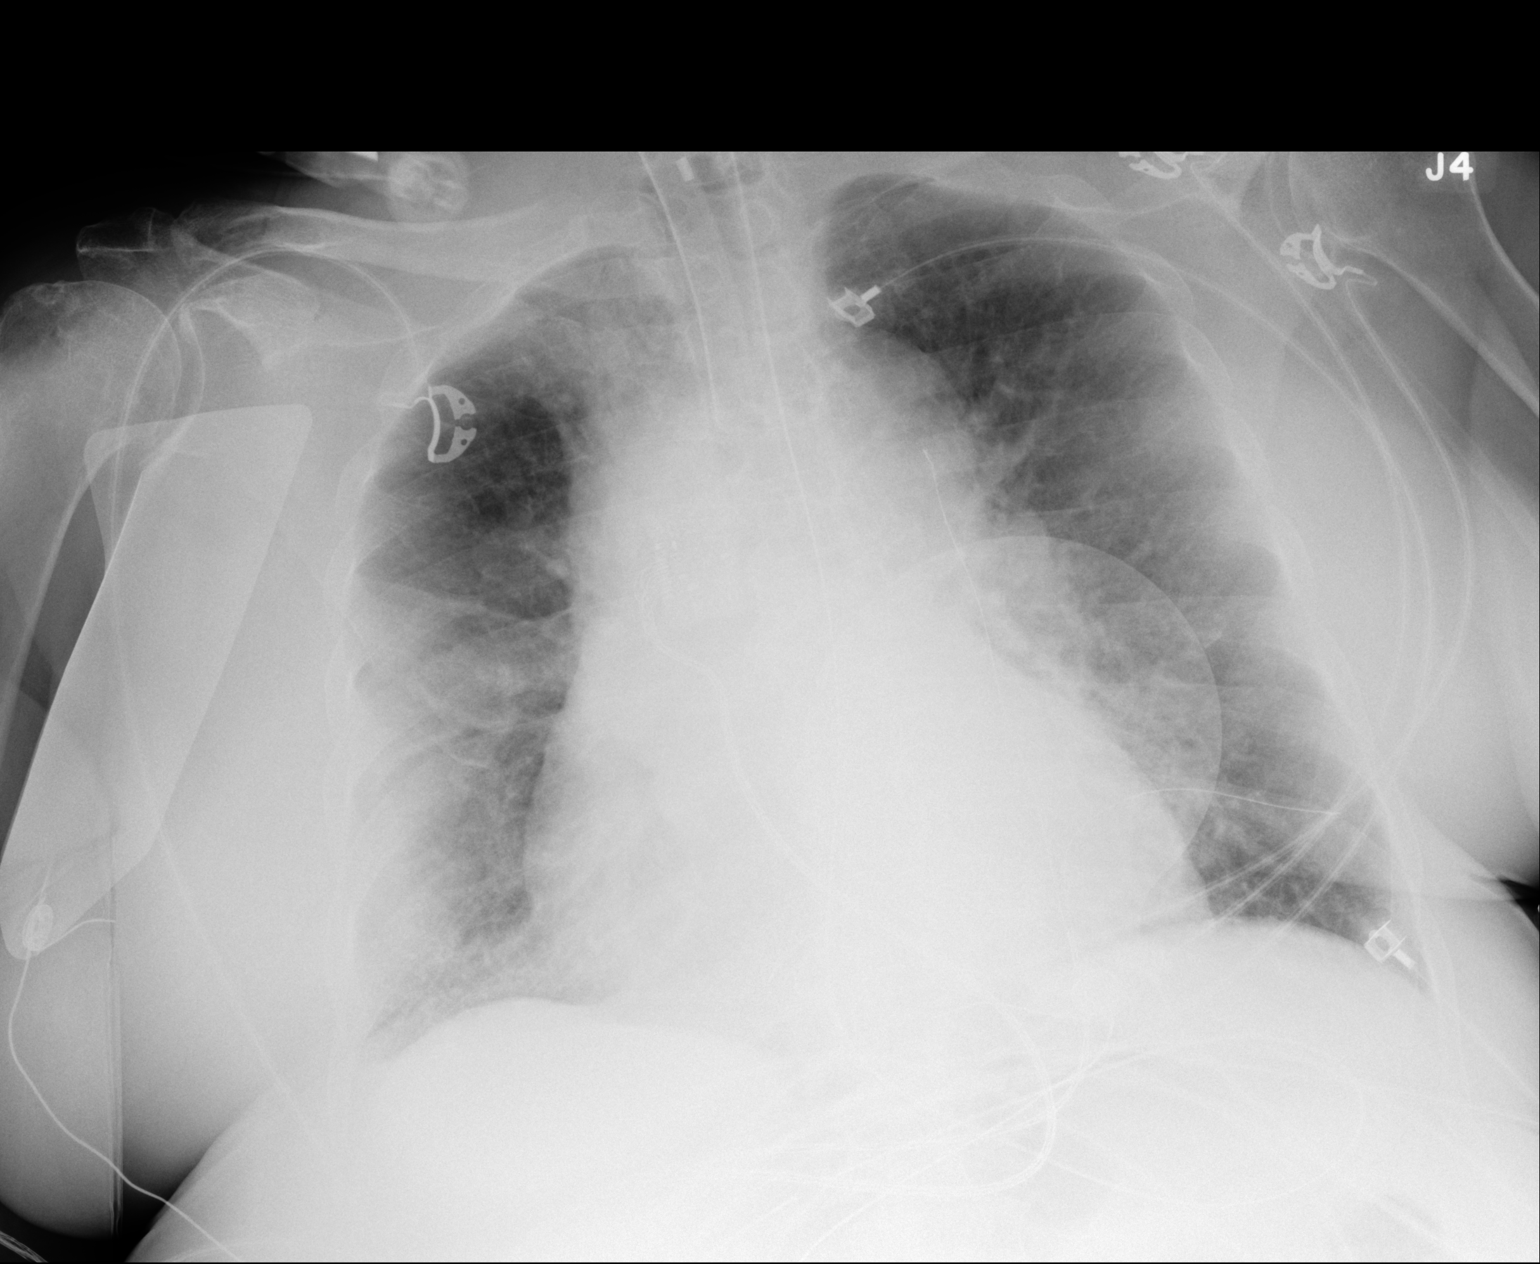

[1 of 1 positions shown; findings below may reference images not displayed]

FINDINGS: Endotracheal tube tip is 4.4 cm above the carina. Nasogastric tube
tip and side port are in the stomach. No pneumothorax. There is no
edema or consolidation. Heart is slightly enlarged with pulmonary
vascularity within normal limits. No adenopathy.
IMPRESSION: Tube positions as described without pneumothorax. Stable cardiac
prominence. No edema or consolidation.

## 2016-10-06 IMAGING — CT CT HEAD W/O CM
1 of 2 series · 13 of 30 positions shown, 17 images · non-contrast
Comparison: None.

CLINICAL DATA: 82-year-old female presenting to the emergency
department with unresponsiveness.

EXAM:
CT HEAD WITHOUT CONTRAST
TECHNIQUE: Contiguous axial images were obtained from the base of the skull
through the vertex without intravenous contrast.

[Series 1: — · axial · 0.49mm/px · z∈[+8,+148]mm · 13 of 34 slices shown, 17 images]
[im 3/34  brain]
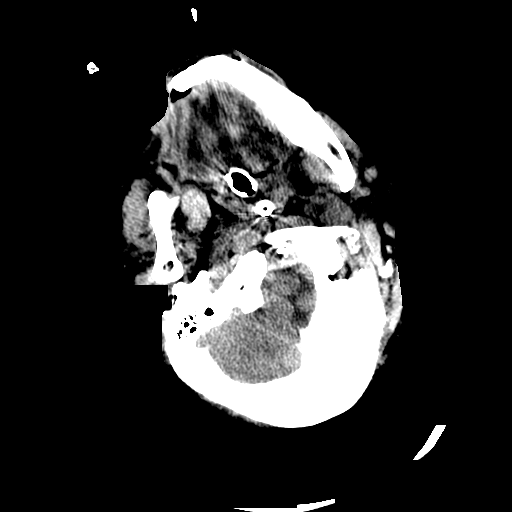
[im 3/34  bone]
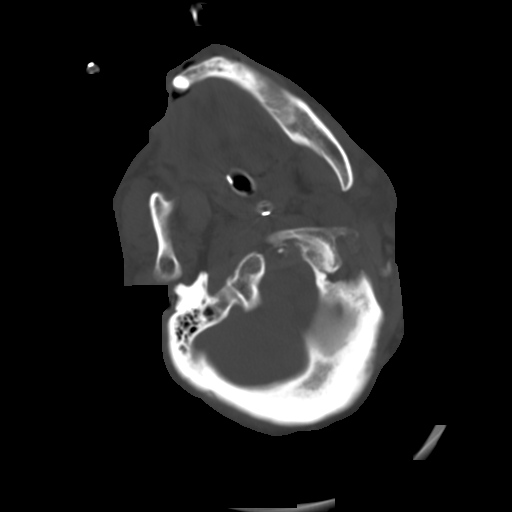
[im 5/34  brain]
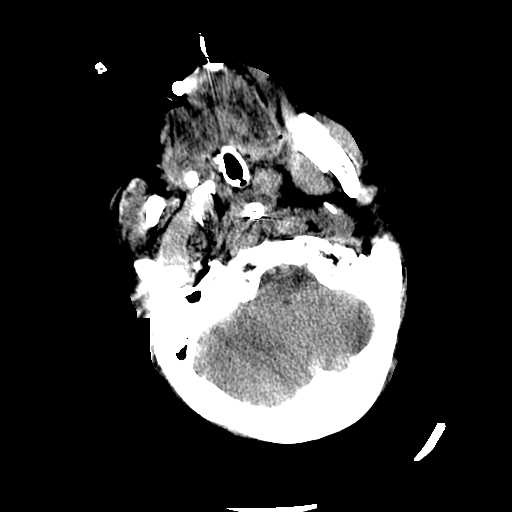
[im 8/34  brain]
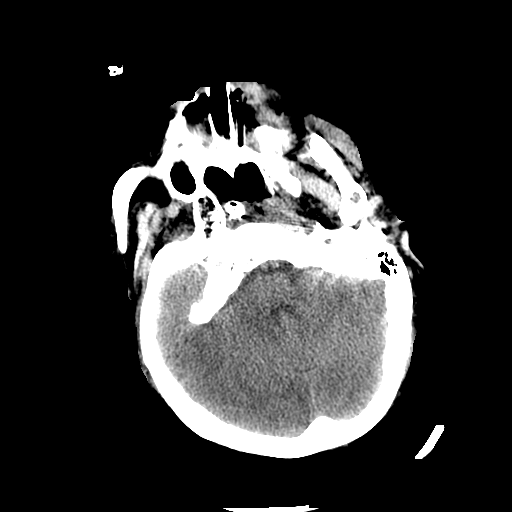
[im 10/34  brain]
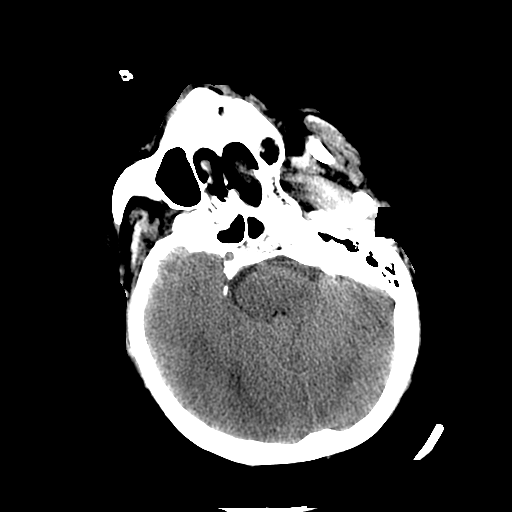
[im 12/34  brain]
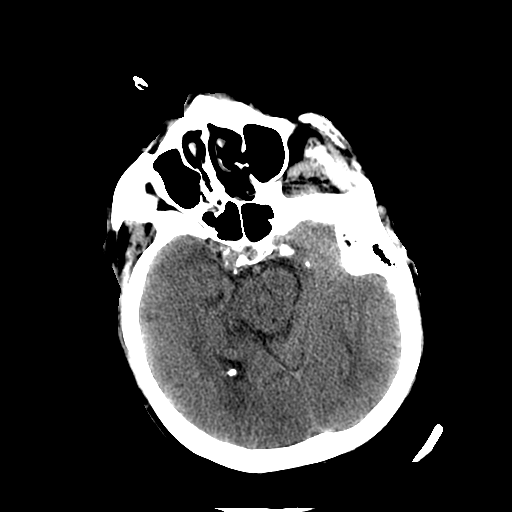
[im 12/34  bone]
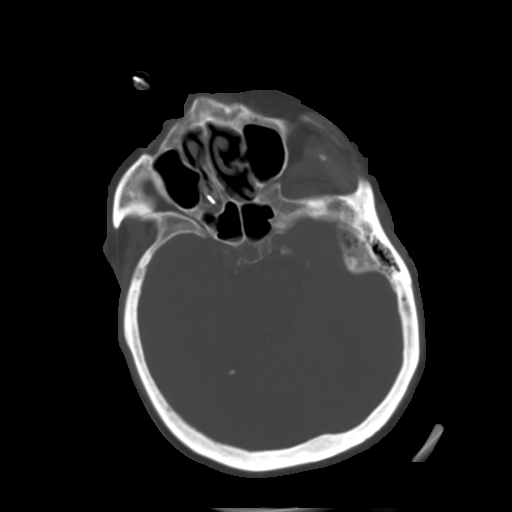
[im 15/34  brain]
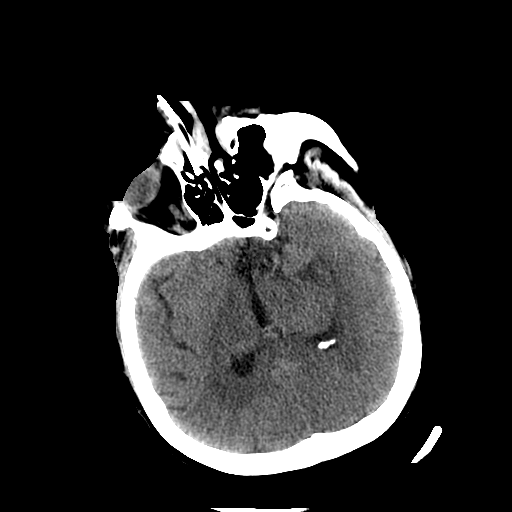
[im 17/34  brain]
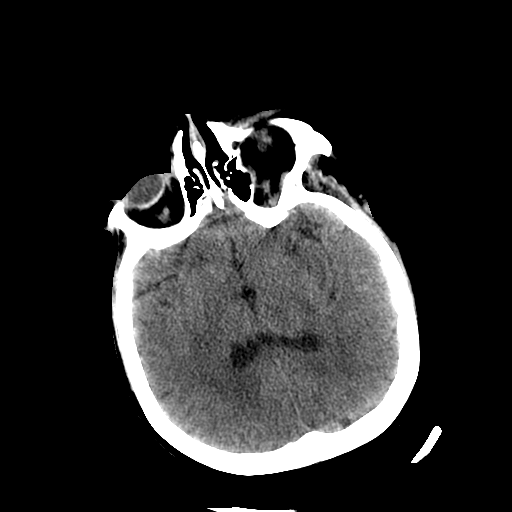
[im 19/34  brain]
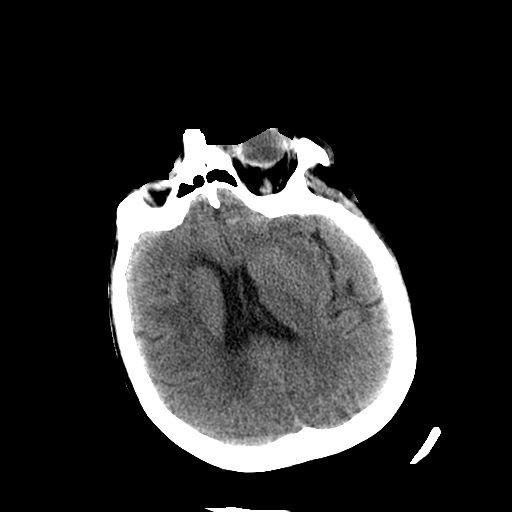
[im 22/34  brain]
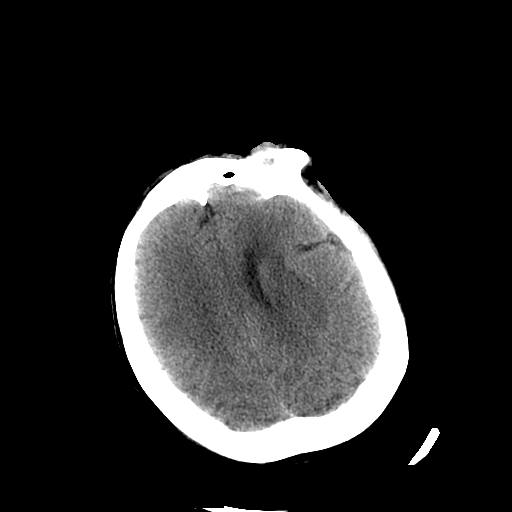
[im 22/34  bone]
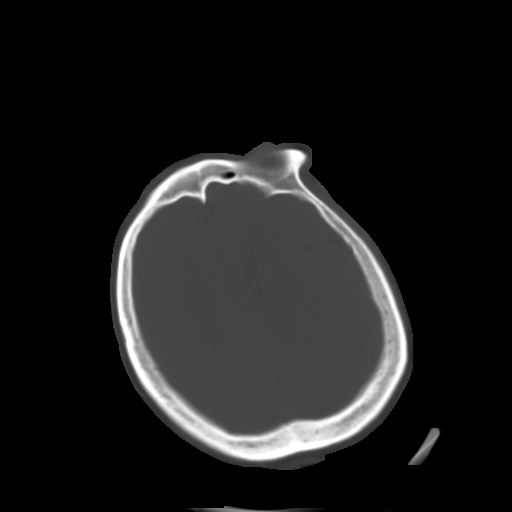
[im 24/34  brain]
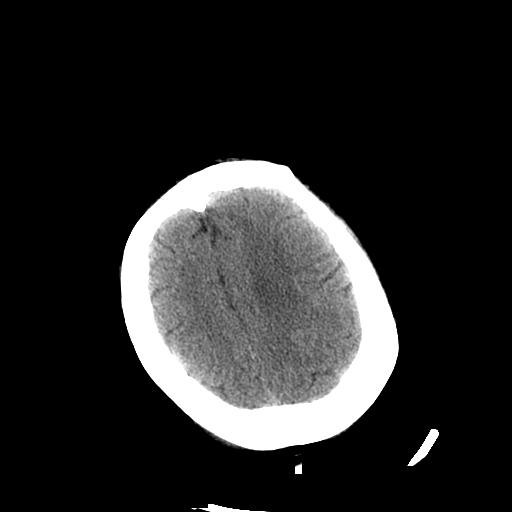
[im 26/34  brain]
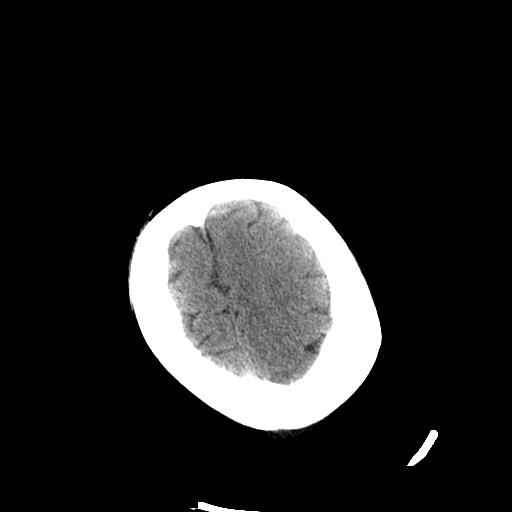
[im 29/34  brain]
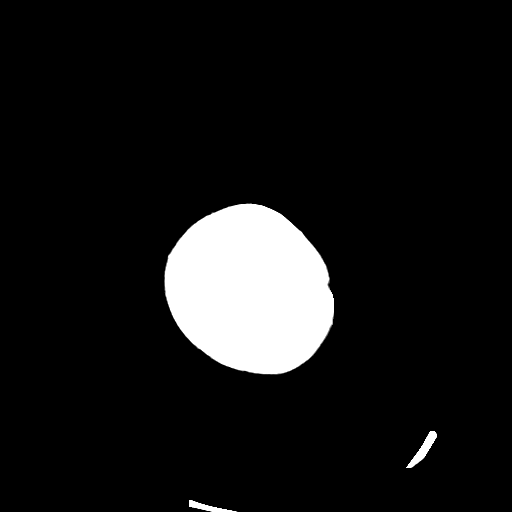
[im 31/34  brain]
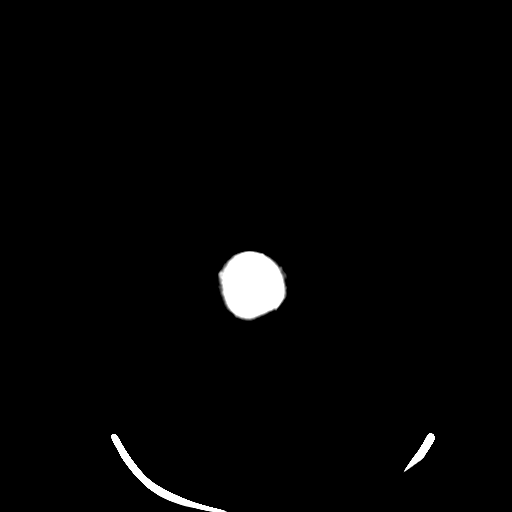
[im 31/34  bone]
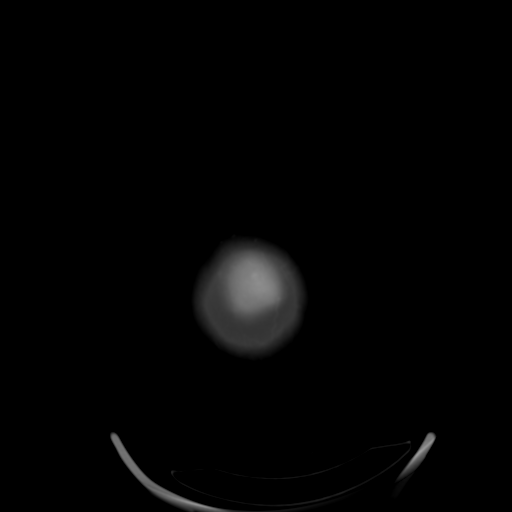

[13 of 30 positions shown; findings below may reference images not displayed]

FINDINGS: There is slight prominence of the ventricles and sulci compatible
with age-related volume loss. Periventricular and deep white matter
hypodensities represent chronic microvascular ischemic changes.
There is no intracranial hemorrhage. No mass effect or midline shift
identified.

The visualized paranasal sinuses and mastoid air cells are well
aerated. The calvarium is intact. Bilateral periorbital swelling.

Endotracheal and enteric tube are partially visualized.
IMPRESSION: No acute intracranial pathology.

Age-related atrophy and chronic microvascular ischemic disease.

If symptoms persist and there are no contraindications, MRI may
provide better evaluation if clinically indicated.
